# Patient Record
Sex: Female | Born: 1965
Health system: Southern US, Community
[De-identification: ages and names within clinical notes are randomized; demographics above are authoritative.]

## PROBLEM LIST (undated history)

## (undated) DIAGNOSIS — C50919 Malignant neoplasm of unspecified site of unspecified female breast: Secondary | ICD-10-CM

## (undated) DIAGNOSIS — M1990 Unspecified osteoarthritis, unspecified site: Secondary | ICD-10-CM

## (undated) DIAGNOSIS — Z9221 Personal history of antineoplastic chemotherapy: Secondary | ICD-10-CM

## (undated) DIAGNOSIS — C50911 Malignant neoplasm of unspecified site of right female breast: Secondary | ICD-10-CM

## (undated) DIAGNOSIS — Z923 Personal history of irradiation: Secondary | ICD-10-CM

## (undated) DIAGNOSIS — Z9189 Other specified personal risk factors, not elsewhere classified: Secondary | ICD-10-CM

## (undated) DIAGNOSIS — N200 Calculus of kidney: Secondary | ICD-10-CM

## (undated) DIAGNOSIS — K219 Gastro-esophageal reflux disease without esophagitis: Secondary | ICD-10-CM

## (undated) HISTORY — DX: Gastro-esophageal reflux disease without esophagitis: K21.9

## (undated) HISTORY — DX: Calculus of kidney: N20.0

## (undated) HISTORY — DX: Other specified personal risk factors, not elsewhere classified: Z91.89

## (undated) HISTORY — DX: Unspecified osteoarthritis, unspecified site: M19.90

---

## 1993-03-07 DIAGNOSIS — Z789 Other specified health status: Secondary | ICD-10-CM

## 1993-03-07 HISTORY — DX: Other specified health status: Z78.9

## 1993-03-07 HISTORY — PX: ABDOMINAL HYSTERECTOMY: SHX81

## 2004-07-22 ENCOUNTER — Ambulatory Visit: Payer: Self-pay

## 2004-12-29 ENCOUNTER — Ambulatory Visit: Payer: Self-pay | Admitting: Internal Medicine

## 2005-08-02 ENCOUNTER — Ambulatory Visit: Payer: Self-pay

## 2006-08-14 ENCOUNTER — Ambulatory Visit: Payer: Self-pay

## 2007-02-20 ENCOUNTER — Other Ambulatory Visit: Payer: Self-pay

## 2007-02-20 ENCOUNTER — Emergency Department: Payer: Self-pay | Admitting: Emergency Medicine

## 2007-08-15 ENCOUNTER — Ambulatory Visit: Payer: Self-pay

## 2008-08-15 ENCOUNTER — Ambulatory Visit: Payer: Self-pay | Admitting: Obstetrics and Gynecology

## 2008-12-31 ENCOUNTER — Emergency Department: Payer: Self-pay | Admitting: Emergency Medicine

## 2009-08-18 ENCOUNTER — Ambulatory Visit: Payer: Self-pay

## 2010-07-28 ENCOUNTER — Ambulatory Visit: Payer: Self-pay

## 2011-08-03 ENCOUNTER — Ambulatory Visit: Payer: Self-pay

## 2012-08-29 ENCOUNTER — Ambulatory Visit: Payer: Self-pay

## 2013-02-16 ENCOUNTER — Ambulatory Visit: Payer: Self-pay | Admitting: Emergency Medicine

## 2013-02-16 LAB — URINALYSIS, COMPLETE
Bilirubin,UR: NEGATIVE
Glucose,UR: NEGATIVE mg/dL (ref 0–75)
Leukocyte Esterase: NEGATIVE
Nitrite: NEGATIVE
Ph: 7.5 (ref 4.5–8.0)
Protein: NEGATIVE

## 2013-02-18 LAB — URINE CULTURE

## 2013-02-24 ENCOUNTER — Emergency Department: Payer: Self-pay | Admitting: Emergency Medicine

## 2013-02-24 LAB — URINALYSIS, COMPLETE
Bilirubin,UR: NEGATIVE
Glucose,UR: NEGATIVE mg/dL (ref 0–75)
Ketone: NEGATIVE
Leukocyte Esterase: NEGATIVE
Nitrite: NEGATIVE
RBC,UR: <1 /HPF (ref 0–5)
Specific Gravity: 1.011 (ref 1.003–1.030)
WBC UR: <1 /HPF (ref 0–5)

## 2013-04-03 ENCOUNTER — Ambulatory Visit: Payer: Self-pay | Admitting: Urology

## 2013-06-14 ENCOUNTER — Ambulatory Visit: Payer: Self-pay | Admitting: Gastroenterology

## 2013-06-19 LAB — PATHOLOGY REPORT

## 2013-10-03 ENCOUNTER — Ambulatory Visit: Payer: Self-pay | Admitting: Physician Assistant

## 2014-03-07 HISTORY — PX: COLONOSCOPY: SHX174

## 2014-09-02 ENCOUNTER — Other Ambulatory Visit: Payer: Self-pay | Admitting: Physician Assistant

## 2014-09-02 DIAGNOSIS — Z1231 Encounter for screening mammogram for malignant neoplasm of breast: Secondary | ICD-10-CM

## 2014-10-06 ENCOUNTER — Ambulatory Visit
Admission: RE | Admit: 2014-10-06 | Discharge: 2014-10-06 | Disposition: A | Discharge status: Home / Self Care | Payer: 59 | Source: Ambulatory Visit | Attending: Physician Assistant | Admitting: Physician Assistant

## 2014-10-06 ENCOUNTER — Ambulatory Visit: Payer: Self-pay

## 2014-10-06 DIAGNOSIS — Z1231 Encounter for screening mammogram for malignant neoplasm of breast: Secondary | ICD-10-CM | POA: Insufficient documentation

## 2015-03-08 DIAGNOSIS — Z923 Personal history of irradiation: Secondary | ICD-10-CM

## 2015-03-08 DIAGNOSIS — Z9221 Personal history of antineoplastic chemotherapy: Secondary | ICD-10-CM

## 2015-03-08 DIAGNOSIS — C50919 Malignant neoplasm of unspecified site of unspecified female breast: Secondary | ICD-10-CM

## 2015-03-08 HISTORY — DX: Personal history of irradiation: Z92.3

## 2015-03-08 HISTORY — DX: Personal history of antineoplastic chemotherapy: Z92.21

## 2015-03-08 HISTORY — DX: Malignant neoplasm of unspecified site of unspecified female breast: C50.919

## 2015-03-08 HISTORY — PX: BREAST BIOPSY: SHX20

## 2015-09-17 ENCOUNTER — Other Ambulatory Visit: Payer: Self-pay | Admitting: Physician Assistant

## 2015-09-17 DIAGNOSIS — Z1231 Encounter for screening mammogram for malignant neoplasm of breast: Secondary | ICD-10-CM

## 2015-10-07 ENCOUNTER — Ambulatory Visit: Payer: 59

## 2015-10-14 ENCOUNTER — Other Ambulatory Visit: Payer: Self-pay | Admitting: Physician Assistant

## 2015-10-14 ENCOUNTER — Ambulatory Visit
Admission: RE | Admit: 2015-10-14 | Discharge: 2015-10-14 | Disposition: A | Discharge status: Home / Self Care | Payer: 59 | Source: Ambulatory Visit | Attending: Physician Assistant | Admitting: Physician Assistant

## 2015-10-14 DIAGNOSIS — R928 Other abnormal and inconclusive findings on diagnostic imaging of breast: Secondary | ICD-10-CM | POA: Diagnosis not present

## 2015-10-14 DIAGNOSIS — Z1231 Encounter for screening mammogram for malignant neoplasm of breast: Secondary | ICD-10-CM

## 2015-10-22 ENCOUNTER — Other Ambulatory Visit: Payer: Self-pay | Admitting: Physician Assistant

## 2015-10-22 DIAGNOSIS — N6310 Unspecified lump in the right breast, unspecified quadrant: Secondary | ICD-10-CM

## 2015-10-22 DIAGNOSIS — N6489 Other specified disorders of breast: Secondary | ICD-10-CM

## 2015-10-23 ENCOUNTER — Ambulatory Visit
Admission: RE | Admit: 2015-10-23 | Discharge: 2015-10-23 | Disposition: A | Discharge status: Home / Self Care | Payer: 59 | Source: Ambulatory Visit | Attending: Physician Assistant | Admitting: Physician Assistant

## 2015-10-23 DIAGNOSIS — N6489 Other specified disorders of breast: Secondary | ICD-10-CM | POA: Diagnosis present

## 2015-10-23 DIAGNOSIS — N63 Unspecified lump in breast: Secondary | ICD-10-CM | POA: Diagnosis not present

## 2015-10-23 DIAGNOSIS — N6310 Unspecified lump in the right breast, unspecified quadrant: Secondary | ICD-10-CM

## 2015-10-27 ENCOUNTER — Other Ambulatory Visit: Payer: Self-pay | Admitting: Physician Assistant

## 2015-10-27 DIAGNOSIS — N6310 Unspecified lump in the right breast, unspecified quadrant: Secondary | ICD-10-CM

## 2015-11-05 ENCOUNTER — Ambulatory Visit
Admission: RE | Admit: 2015-11-05 | Discharge: 2015-11-05 | Disposition: A | Discharge status: Home / Self Care | Payer: 59 | Source: Ambulatory Visit | Attending: Physician Assistant | Admitting: Physician Assistant

## 2015-11-05 DIAGNOSIS — C50911 Malignant neoplasm of unspecified site of right female breast: Secondary | ICD-10-CM | POA: Insufficient documentation

## 2015-11-05 DIAGNOSIS — N6310 Unspecified lump in the right breast, unspecified quadrant: Secondary | ICD-10-CM

## 2015-11-05 DIAGNOSIS — N63 Unspecified lump in breast: Secondary | ICD-10-CM | POA: Diagnosis present

## 2015-11-12 ENCOUNTER — Telehealth: Payer: Self-pay | Admitting: *Deleted

## 2015-11-12 NOTE — Telephone Encounter (Signed)
Called and left patient a message to return my call.  I would like to establish navigation services.  Patient has an appointment with Dr. Tamala Julian on 11/16/15.

## 2015-11-17 ENCOUNTER — Other Ambulatory Visit: Payer: Self-pay | Admitting: Surgery

## 2015-11-17 DIAGNOSIS — C50811 Malignant neoplasm of overlapping sites of right female breast: Secondary | ICD-10-CM

## 2015-11-18 ENCOUNTER — Other Ambulatory Visit: Payer: Self-pay | Admitting: Surgery

## 2015-11-18 ENCOUNTER — Encounter: Payer: Self-pay | Admitting: Oncology

## 2015-11-18 DIAGNOSIS — C50911 Malignant neoplasm of unspecified site of right female breast: Secondary | ICD-10-CM

## 2015-11-18 DIAGNOSIS — C50511 Malignant neoplasm of lower-outer quadrant of right female breast: Secondary | ICD-10-CM | POA: Insufficient documentation

## 2015-11-18 HISTORY — DX: Malignant neoplasm of unspecified site of right female breast: C50.911

## 2015-11-18 NOTE — Progress Notes (Signed)
[place]  [phone] [phone]) [phone]  ID: Melanie Campos OB: [birth year]  MR#: [id number]  YTK#:[id number]  Patient Care Team: Marinda Elk, MD as PCP - General (Physician Assistant)  CHIEF COMPLAINT: Poorly differentiated carcinoma of the right breast.  INTERVAL HISTORY: Patient is a 50 year old female who underwent routine screening mammogram which revealed a Moll abnormality in her right breast. Subsequent biopsy revealed a cystic area, but the fluid aspirated was bloody and had poorly differentiated carcinoma as well. No distinct mass is noted in her breast. Pathology is unclear if this is breast in origin. Currently, patient is anxious but otherwise feels well. She has no neurologic complaints. She denies any recent fevers or illnesses. She has a good appetite and denies weight loss. She has no chest pain or shortness of breath. She denies any nausea, vomiting, constipation, or diarrhea. She has no urinary complaints. Patient feels at her baseline and offers no specific complaints today.  REVIEW OF SYSTEMS:   Review of Systems  Constitutional: Negative.  Negative for fever, malaise/fatigue and weight loss.  Respiratory: Negative.  Negative for cough, hemoptysis and shortness of breath.   Cardiovascular: Negative.  Negative for chest pain and leg swelling.  Gastrointestinal: Negative for abdominal pain, blood in stool and melena.  Genitourinary: Negative.   Musculoskeletal: Negative.   Neurological: Negative.  Negative for weakness.  Psychiatric/Behavioral: The patient is nervous/anxious.     As per HPI. Otherwise, a complete review of systems is negatve.  PAST MEDICAL HISTORY: Past Medical History:  Diagnosis Date  . Arthritis   . Carcinoma of breast ([place]) 11/18/2015  . GERD (gastroesophageal reflux disease)   . Kidney stones   . Last menstrual period (LMP) > 10 days ago 1995    PAST SURGICAL HISTORY: Past Surgical History:    Procedure Laterality Date  . ABDOMINAL HYSTERECTOMY     partial  . COLONOSCOPY  2016    FAMILY HISTORY: Family History  Problem Relation Age of Onset  . Breast cancer Neg Hx     ADVANCED DIRECTIVES (Y/N):  N  HEALTH MAINTENANCE: Social History  Substance Use Topics  . Smoking status: Never Smoker  . Smokeless tobacco: Never Used  . Alcohol use Not on file     Colonoscopy:  PAP:  Bone density:  Lipid panel:  No Known Allergies  Current Outpatient Prescriptions  Medication Sig Dispense Refill  . carisoprodol (SOMA) 350 MG tablet Take 1 tablet by mouth 4 (four) times daily as needed.    . meloxicam (MOBIC) 7.5 MG tablet Take 1 tablet by mouth daily as needed.    Marland Kitchen omeprazole (PRILOSEC) 20 MG capsule Take 1 capsule by mouth daily.    Marland Kitchen acetaminophen (TYLENOL) 325 MG tablet Take 2 tablets by mouth every 4 (four) hours as needed.    Marland Kitchen aspirin EC 81 MG tablet Take 1 tablet by mouth daily.    Marland Kitchen ibuprofen (ADVIL,MOTRIN) 200 MG tablet Take 1 tablet by mouth as needed.    . Multiple Vitamin (MULTI-VITAMINS) TABS Take 1 tablet by mouth daily.    . OnabotulinumtoxinA (BOTOX IJ) Inject 1 Dose as directed every 3 (three) months.     No current facility-administered medications for this visit.     OBJECTIVE: Vitals:   11/19/15 0845  BP: 129/89  Pulse: 72  Resp: 18  Temp: (!) 95.9 F (35.5 C)     Body mass index is 29.9 kg/m.    ECOG FS:0 - Asymptomatic  General: Well-developed, well-nourished,  no acute distress. Eyes: Pink conjunctiva, anicteric sclera. HEENT: Normocephalic, moist mucous membranes, clear oropharnyx. Breasts: Patient requested exam be deferred today. Lungs: Clear to auscultation bilaterally. Heart: Regular rate and rhythm. No rubs, murmurs, or gallops. Abdomen: Soft, nontender, nondistended. No organomegaly noted, normoactive bowel sounds. Musculoskeletal: No edema, cyanosis, or clubbing. Neuro: Alert, answering all questions appropriately. Cranial  nerves grossly intact. Skin: No rashes or petechiae noted. Psych: Normal affect. Lymphatics: No cervical, calvicular, axillary or inguinal LAD.   LAB RESULTS:  No results found for: NA, K, CL, CO2, GLUCOSE, BUN, CREATININE, CALCIUM, PROT, ALBUMIN, AST, ALT, ALKPHOS, BILITOT, GFRNONAA, GFRAA  No results found for: WBC, NEUTROABS, HGB, HCT, MCV, PLT   STUDIES: US Aspiration  Addendum Date: 11/13/2015   ADDENDUM REPORT: 11/13/2015 10:38 ADDENDUM: Cytology of the right breast cyst aspiration revealed BREAST, RIGHT 6:00; ULTRASOUND GUIDED ASPIRATION: POSITIVE FOR MALIGNANCY. POORLY DIFFERENTIATED CARCINOMA. Comment: Lymphoid stroma is present. The lesion could be a primary breast carcinoma with prominent lymphoid infiltrate. Metastasis to an intramammary node could be a consideration. ER, PR, and HER2 testing is deferred to an excision specimen but could be performed on the cell block material if necessary. These findings were communicated to [facility] in Dr. Theda Sers' office on 11/06/2015. Read back procedure was performed. This was found to be concordant by Dr. Theda Sers. Recommendation: Further evaluation by either surgical excision or needle core biopsy. Results and recommendations were relayed to Paulita Cradle, PA-C's nurse by Jetta Lout, [facility]) on 11/10/15. She stated they will contact the patient and make the referral. At the patient's request, she was contacted by phone on 11/13/15 by Jetta Lout, [place] for a post biopsy check and confirmation that she was aware of results. She stated she did well following the biopsy with no bleeding, bruising, or hematoma. She has discussed results with Paulita Cradle, PA and has an appointment with Dr. Tamala Julian (surgeon) on 11/16/15. The patient was encouraged to call the [facility] or [place], [place] with any further questions or concerns. Addendum by Jetta Lout, RRA on 11/13/15. Electronically  Signed   By: Ammie Ferrier M.D.   On: 11/13/2015 10:38   Result Date: 11/13/2015 CLINICAL DATA:  50 year old female presenting for cyst aspiration of a right breast cyst. EXAM: ULTRASOUND GUIDED RIGHT BREAST CYST ASPIRATION COMPARISON:  Previous exams. PROCEDURE: Using sterile technique, 1% lidocaine, under direct ultrasound visualization, needle aspiration of a cyst in the right breast at 6 o'clock was performed. Approximately 2 mL of bloody fluid was aspirated from the cyst. This was sent for cytologic evaluation. The cyst collapsed entirely with aspiration. A wing shaped biopsy marking clip was placed following aspiration. A post aspiration mammogram will be performed and dictated separately. IMPRESSION: Ultrasound-guided aspiration of a right breast cyst at 6 o'clock No apparent complications. RECOMMENDATIONS: Appropriate follow-up will depend on the cytology lab results. Electronically Signed: By: Ammie Ferrier M.D. On: 11/05/2015 14:01   US Breast Ltd Uni Right Inc Axilla  Result Date: 10/23/2015 CLINICAL DATA:  Patient was called back from screening mammogram for a possible mass and asymmetry in the right breast. EXAM: 2D DIGITAL DIAGNOSTIC RIGHT MAMMOGRAM WITH CAD AND ADJUNCT TOMO ULTRASOUND RIGHT BREAST COMPARISON:  Previous exam(s). ACR Breast Density Category c: The breast tissue is heterogeneously dense, which may obscure small masses. FINDINGS: Additional imaging of the right breast was performed showing there is a 10 mm mass with obscured borders in the 6 o'clock region of the breast. There  are no malignant type microcalcifications. Mammographic images were processed with CAD. On physical exam, I do not palpate a mass in the 6 o'clock region of the right breast. Targeted ultrasound is performed, showing a hypoechoic mass with well-defined borders in the 6 o'clock region the right breast 6 cm from the nipple measuring 7 x 6 x 6 mm. There are internal echoes in the mass and it does not meet  the criteria of a simple cyst. Sonographic evaluation of the right axilla does not show any enlarged adenopathy. IMPRESSION: Indeterminate mass in the 6 o'clock region the right breast. RECOMMENDATION: Attempt at ultrasound-guided aspiration of the mass in the 6 o'clock region of the right breast is recommended. If the lesion does not decompress with aspiration I would recommend an ultrasound-guided core biopsy. I have discussed the findings and recommendations with the patient. Results were also provided in writing at the conclusion of the visit. If applicable, a reminder letter will be sent to the patient regarding the next appointment. BI-RADS CATEGORY  4: Suspicious. Electronically Signed   By: Lillia Mountain M.D.   On: 10/23/2015 14:59   Mm Diag Breast Tomo Uni Right  Result Date: 10/23/2015 CLINICAL DATA:  Patient was called back from screening mammogram for a possible mass and asymmetry in the right breast. EXAM: 2D DIGITAL DIAGNOSTIC RIGHT MAMMOGRAM WITH CAD AND ADJUNCT TOMO ULTRASOUND RIGHT BREAST COMPARISON:  Previous exam(s). ACR Breast Density Category c: The breast tissue is heterogeneously dense, which may obscure small masses. FINDINGS: Additional imaging of the right breast was performed showing there is a 10 mm mass with obscured borders in the 6 o'clock region of the breast. There are no malignant type microcalcifications. Mammographic images were processed with CAD. On physical exam, I do not palpate a mass in the 6 o'clock region of the right breast. Targeted ultrasound is performed, showing a hypoechoic mass with well-defined borders in the 6 o'clock region the right breast 6 cm from the nipple measuring 7 x 6 x 6 mm. There are internal echoes in the mass and it does not meet the criteria of a simple cyst. Sonographic evaluation of the right axilla does not show any enlarged adenopathy. IMPRESSION: Indeterminate mass in the 6 o'clock region the right breast. RECOMMENDATION: Attempt at  ultrasound-guided aspiration of the mass in the 6 o'clock region of the right breast is recommended. If the lesion does not decompress with aspiration I would recommend an ultrasound-guided core biopsy. I have discussed the findings and recommendations with the patient. Results were also provided in writing at the conclusion of the visit. If applicable, a reminder letter will be sent to the patient regarding the next appointment. BI-RADS CATEGORY  4: Suspicious. Electronically Signed   By: Lillia Mountain M.D.   On: 10/23/2015 14:59   Mm Clip Placement Right  Result Date: 11/05/2015 CLINICAL DATA:  Post biopsy mammogram of the right breast following clip placement. EXAM: DIAGNOSTIC RIGHT MAMMOGRAM POST ULTRASOUND ASPIRATION COMPARISON:  Previous exam(s). FINDINGS: Mammographic images were obtained following ultrasound guided aspiration of a right breast cyst at 6 o'clock. The wing shaped biopsy marking clip is appropriately positioned at the site of aspiration at 6 o'clock. IMPRESSION: Appropriate positioning of the wing shaped biopsy marking clip at the site of aspiration at 6 o'clock. Final Assessment: Post Procedure Mammograms for Marker Placement Electronically Signed   By: Ammie Ferrier M.D.   On: 11/05/2015 14:03    ASSESSMENT: Poorly differentiated carcinoma of the right breast  PLAN:  1. Poorly differentiated carcinoma of the right breast:  Case discussed with radiology and pathology and it is unclear if this is a breast primary or possibly a metastatic focus. Case was discussed at breast tumor board and it was determined to further assess the patient with imaging using CT scan of the chest, abdomen, and pelvis. If this is positive, patient will return to clinic to discuss the results. If negative we will pursue MRI of the breast. Currently, patient is scheduled for lumpectomy on December 08, 2015. We will keep this scheduled pending the results of her imaging. Tumor markers were ordered today for  completeness and are pending at time of dictation.  Approximately 45 minutes was spent in discussion of which greater than 50% was consultation.   Patient expressed understanding and was in agreement with this plan. She also understands that She can call clinic at any time with any questions, concerns, or complaints.   No matching staging information was found for the patient.  Lloyd Huger, MD   11/19/2015 12:24 PM

## 2015-11-19 ENCOUNTER — Encounter: Payer: Self-pay | Admitting: *Deleted

## 2015-11-19 ENCOUNTER — Encounter: Payer: Self-pay | Admitting: Oncology

## 2015-11-19 ENCOUNTER — Inpatient Hospital Stay: Payer: 59

## 2015-11-19 ENCOUNTER — Inpatient Hospital Stay: Payer: 59 | Attending: Oncology | Admitting: Oncology

## 2015-11-19 DIAGNOSIS — C50911 Malignant neoplasm of unspecified site of right female breast: Secondary | ICD-10-CM | POA: Insufficient documentation

## 2015-11-19 DIAGNOSIS — M1990 Unspecified osteoarthritis, unspecified site: Secondary | ICD-10-CM | POA: Insufficient documentation

## 2015-11-19 DIAGNOSIS — Z79899 Other long term (current) drug therapy: Secondary | ICD-10-CM | POA: Insufficient documentation

## 2015-11-19 DIAGNOSIS — K219 Gastro-esophageal reflux disease without esophagitis: Secondary | ICD-10-CM | POA: Diagnosis not present

## 2015-11-19 NOTE — Progress Notes (Signed)
  Oncology Nurse Navigator Documentation  Navigator Location: [facility]-Med Onc (11/19/15 1600) Navigator Encounter Type: Initial MedOnc (11/19/15 1600)           Patient Visit Type: Initial (11/19/15 1600) Treatment Phase: Pre-Tx/Tx Discussion (11/19/15 1600) Barriers/Navigation Needs: Education (11/19/15 1600) Education: Understanding Cancer/ Treatment Options;Coping with Diagnosis/ Prognosis;Newly Diagnosed Cancer Education (11/19/15 1600)          Support Groups/Services: Breast Support Group (11/19/15 1600)   Acuity: Level 2 (11/19/15 1600)         Time Spent with Patient: 90 (11/19/15 1600)   Met with patient and her husband today during her initial medical oncology visit with Dr. Grayland Ormond.  Dr. Grayland Ormond discussed need for further testing with CT, labs and possible MRI.  Gave patient breast cancer educational literature, "My Breast Cancer Treatment Handbook" by Josephine Igo, RN.  Patient verbalizes understanding that this may not be a breast cancer.  She is to call if she has any questions or needs.

## 2015-11-20 LAB — CYTOLOGY - NON PAP

## 2015-11-25 ENCOUNTER — Ambulatory Visit
Admission: RE | Admit: 2015-11-25 | Discharge: 2015-11-25 | Disposition: A | Discharge status: Home / Self Care | Payer: 59 | Source: Ambulatory Visit | Attending: Oncology | Admitting: Oncology

## 2015-11-25 DIAGNOSIS — C50911 Malignant neoplasm of unspecified site of right female breast: Secondary | ICD-10-CM | POA: Diagnosis not present

## 2015-11-25 HISTORY — DX: Malignant neoplasm of unspecified site of right female breast: C50.911

## 2015-11-25 MED ORDER — IOPAMIDOL (ISOVUE-300) INJECTION 61%
75.0000 mL | Freq: Once | INTRAVENOUS | Status: AC | PRN
  Administered 2015-11-25: 75 mL via INTRAVENOUS

## 2015-11-27 ENCOUNTER — Ambulatory Visit: Admission: RE | Admit: 2015-11-27 | Payer: 59 | Source: Ambulatory Visit | Admitting: Surgery

## 2015-11-27 ENCOUNTER — Encounter: Payer: Self-pay | Admitting: *Deleted

## 2015-11-27 DIAGNOSIS — C50911 Malignant neoplasm of unspecified site of right female breast: Secondary | ICD-10-CM | POA: Diagnosis not present

## 2015-11-27 NOTE — Patient Instructions (Addendum)
  Your procedure is scheduled [id number] 3, 2017 (Tuesday) Report to Radiology Desk ([place])  ARRIVAL TIME 8:00 AM   Remember: Instructions that are not followed completely may result in serious medical risk, up to and including death, or upon the discretion of your surgeon and anesthesiologist your surgery may need to be rescheduled.    _x___ 1. Do not eat food or drink liquids after midnight. No gum chewing or hard candies.     _x__ 2. No Alcohol for 24 hours before or after surgery.   _x__3. No Smoking for 24 prior to surgery.   ____  4. Bring all medications with you on the day of surgery if instructed.    __x__ 5. Notify your doctor if there is any change in your medical condition     (cold, fever, infections).     Do not wear jewelry, make-up, hairpins, clips or nail polish.  Do not wear lotions, powders, or perfumes. You may wear deodorant.  Do not shave 48 hours prior to surgery. Men may shave face and neck.  Do not bring valuables to the hospital.    [facility] is not responsible for any belongings or valuables.               Contacts, dentures or bridgework may not be worn into surgery.  Leave your suitcase in the car. After surgery it may be brought to your room.  For patients admitted to the hospital, discharge time is determined by your treatment team.   Patients discharged the day of surgery will not be allowed to drive home.    Please read over the following fact sheets that you were given:   [facility] Preparing for Surgery and or MRSA Information   _x___ Take these medicines the morning of surgery with A SIP OF WATER:    1. Prilosec (Prilosec at bedtime the night before surgery)  2.  3.  4.  5.  6.  ____Fleets enema or Magnesium Citrate as directed.   _x___ Use CHG Soap or sage wipes as directed on instruction sheet   ____ Use inhalers on the day of surgery and bring to hospital day of surgery  ____ Stop metformin 2 days  prior to surgery    ____ Take 1/2 of usual insulin dose the night before surgery and none on the morning of           surgery.   __x__ Stop aspirin or coumadin, or plavix ( Stop Aspirin one week prior to surgery.)  x__ Stop Anti-inflammatories such as Advil, Aleve, Ibuprofen, Motrin, Naproxen,          Naprosyn, Goodies powders, Meloxicam, or aspirin products. Ok to take Tylenol if needed.   ____ Stop supplements until after surgery.    ____ Bring C-Pap to the hospital.

## 2015-11-27 NOTE — OR Nursing (Signed)
Called patient and left a message for her to call back to [facility] To review information for upcoming surgery.

## 2015-12-08 ENCOUNTER — Encounter: Payer: Self-pay | Admitting: *Deleted

## 2015-12-08 ENCOUNTER — Encounter
Admission: RE | Disposition: A | Discharge status: Home / Self Care | Payer: Self-pay | Source: Ambulatory Visit | Attending: Surgery

## 2015-12-08 ENCOUNTER — Ambulatory Visit
Admission: RE | Admit: 2015-12-08 | Discharge: 2015-12-08 | Disposition: A | Discharge status: Home / Self Care | Payer: 59 | Source: Ambulatory Visit | Attending: Surgery | Admitting: Surgery

## 2015-12-08 ENCOUNTER — Ambulatory Visit: Payer: 59 | Admitting: Anesthesiology

## 2015-12-08 ENCOUNTER — Encounter
Admission: RE | Admit: 2015-12-08 | Discharge: 2015-12-08 | Disposition: A | Discharge status: Home / Self Care | Payer: 59 | Source: Ambulatory Visit | Attending: Surgery | Admitting: Surgery

## 2015-12-08 DIAGNOSIS — C50911 Malignant neoplasm of unspecified site of right female breast: Secondary | ICD-10-CM

## 2015-12-08 DIAGNOSIS — Z79899 Other long term (current) drug therapy: Secondary | ICD-10-CM | POA: Insufficient documentation

## 2015-12-08 DIAGNOSIS — M1990 Unspecified osteoarthritis, unspecified site: Secondary | ICD-10-CM | POA: Insufficient documentation

## 2015-12-08 DIAGNOSIS — Z90710 Acquired absence of both cervix and uterus: Secondary | ICD-10-CM | POA: Diagnosis not present

## 2015-12-08 DIAGNOSIS — Z171 Estrogen receptor negative status [ER-]: Secondary | ICD-10-CM | POA: Diagnosis not present

## 2015-12-08 DIAGNOSIS — C50811 Malignant neoplasm of overlapping sites of right female breast: Secondary | ICD-10-CM

## 2015-12-08 DIAGNOSIS — K219 Gastro-esophageal reflux disease without esophagitis: Secondary | ICD-10-CM | POA: Insufficient documentation

## 2015-12-08 DIAGNOSIS — Z87442 Personal history of urinary calculi: Secondary | ICD-10-CM | POA: Diagnosis not present

## 2015-12-08 HISTORY — PX: PARTIAL MASTECTOMY WITH NEEDLE LOCALIZATION: SHX6008

## 2015-12-08 HISTORY — PX: SENTINEL NODE BIOPSY: SHX6608

## 2015-12-08 HISTORY — PX: BREAST EXCISIONAL BIOPSY: SUR124

## 2015-12-08 SURGERY — PARTIAL MASTECTOMY WITH NEEDLE LOCALIZATION
Anesthesia: General | Site: Breast | Laterality: Right | Wound class: Clean

## 2015-12-08 MED ORDER — PROPOFOL 10 MG/ML IV BOLUS
INTRAVENOUS | Status: DC | PRN
  Administered 2015-12-08 (×2): 20 mg via INTRAVENOUS
  Administered 2015-12-08 (×2): 120 mg via INTRAVENOUS
  Administered 2015-12-08: 20 mg via INTRAVENOUS

## 2015-12-08 MED ORDER — OXYCODONE HCL 5 MG PO TABS: 5.0000 mg | ORAL_TABLET | Freq: Once | ORAL | Status: DC | PRN

## 2015-12-08 MED ORDER — FENTANYL CITRATE (PF) 100 MCG/2ML IJ SOLN: 25.0000 ug | INTRAMUSCULAR | Status: DC | PRN

## 2015-12-08 MED ORDER — TECHNETIUM TC 99M SULFUR COLLOID
0.9300 | Freq: Once | INTRAVENOUS | Status: AC | PRN
  Administered 2015-12-08: 0.93 via INTRAVENOUS

## 2015-12-08 MED ORDER — FENTANYL CITRATE (PF) 100 MCG/2ML IJ SOLN
INTRAMUSCULAR | Status: DC | PRN
  Administered 2015-12-08 (×2): 50 ug via INTRAVENOUS

## 2015-12-08 MED ORDER — MIDAZOLAM HCL 2 MG/2ML IJ SOLN
INTRAMUSCULAR | Status: DC | PRN
  Administered 2015-12-08 (×2): 1 mg via INTRAVENOUS

## 2015-12-08 MED ORDER — LIDOCAINE HCL (CARDIAC) 20 MG/ML IV SOLN
INTRAVENOUS | Status: DC | PRN
Start: 2015-12-08 — End: 2015-12-08
  Administered 2015-12-08: 40 mg via INTRAVENOUS

## 2015-12-08 MED ORDER — ONDANSETRON HCL 4 MG/2ML IJ SOLN
INTRAMUSCULAR | Status: DC | PRN
  Administered 2015-12-08: 4 mg via INTRAVENOUS

## 2015-12-08 MED ORDER — HYDROCODONE-ACETAMINOPHEN 5-325 MG PO TABS: 1.0000 | ORAL_TABLET | ORAL | Status: DC | PRN

## 2015-12-08 MED ORDER — LACTATED RINGERS IV SOLN
INTRAVENOUS | Status: DC
  Administered 2015-12-08 (×2): via INTRAVENOUS

## 2015-12-08 MED ORDER — BUPIVACAINE-EPINEPHRINE (PF) 0.5% -1:200000 IJ SOLN
INTRAMUSCULAR | Status: DC | PRN
  Administered 2015-12-08: 20 mL via PERINEURAL

## 2015-12-08 MED ORDER — BUPIVACAINE-EPINEPHRINE (PF) 0.5% -1:200000 IJ SOLN
INTRAMUSCULAR | Status: AC
  Filled 2015-12-08: qty 30

## 2015-12-08 MED ORDER — HYDROCODONE-ACETAMINOPHEN 5-325 MG PO TABS: 1.0000 | ORAL_TABLET | ORAL | 0 refills | Status: DC | PRN

## 2015-12-08 MED ORDER — EPHEDRINE SULFATE 50 MG/ML IJ SOLN
INTRAMUSCULAR | Status: DC | PRN
Start: 2015-12-08 — End: 2015-12-08
  Administered 2015-12-08: 10 mg via INTRAVENOUS

## 2015-12-08 MED ORDER — METHYLENE BLUE 0.5 % INJ SOLN
INTRAVENOUS | Status: AC
  Filled 2015-12-08: qty 10

## 2015-12-08 MED ORDER — OXYCODONE HCL 5 MG/5ML PO SOLN: 5.0000 mg | Freq: Once | ORAL | Status: DC | PRN

## 2015-12-08 SURGICAL SUPPLY — 31 items
BLADE SURG 15 STRL LF DISP TIS (BLADE) ×1 IMPLANT
BLADE SURG 15 STRL SS (BLADE) ×1
CANISTER SUCT 1200ML W/VALVE (MISCELLANEOUS) ×2 IMPLANT
CHLORAPREP W/TINT 26ML (MISCELLANEOUS) ×2 IMPLANT
CNTNR SPEC 2.5X3XGRAD LEK (MISCELLANEOUS) ×2
CONT SPEC 4OZ STER OR WHT (MISCELLANEOUS) ×2
CONTAINER SPEC 2.5X3XGRAD LEK (MISCELLANEOUS) ×2 IMPLANT
DEVICE DUBIN SPECIMEN MAMMOGRA (MISCELLANEOUS) ×2 IMPLANT
DRAPE LAPAROTOMY 77X122 PED (DRAPES) ×2 IMPLANT
ELECT REM PT RETURN 9FT ADLT (ELECTROSURGICAL) ×2
ELECTRODE REM PT RTRN 9FT ADLT (ELECTROSURGICAL) ×1 IMPLANT
GLOVE BIO SURGEON STRL SZ7.5 (GLOVE) ×6 IMPLANT
GOWN STRL REUS W/ TWL LRG LVL3 (GOWN DISPOSABLE) ×3 IMPLANT
GOWN STRL REUS W/TWL LRG LVL3 (GOWN DISPOSABLE) ×3
KIT RM TURNOVER STRD PROC AR (KITS) ×2 IMPLANT
LABEL OR SOLS (LABEL) ×2 IMPLANT
LIQUID BAND (GAUZE/BANDAGES/DRESSINGS) ×2 IMPLANT
MARGIN MAP 10MM (MISCELLANEOUS) ×2 IMPLANT
NDL SAFETY 18GX1.5 (NEEDLE) ×2 IMPLANT
NDL SAFETY 22GX1.5 (NEEDLE) ×2 IMPLANT
NEEDLE HYPO 25X1 1.5 SAFETY (NEEDLE) ×2 IMPLANT
PACK BASIN MINOR ARMC (MISCELLANEOUS) ×2 IMPLANT
SLEVE PROBE SENORX GAMMA FIND (MISCELLANEOUS) ×2 IMPLANT
SUT CHROMIC 4 0 RB 1X27 (SUTURE) ×2 IMPLANT
SUT ETHILON 3-0 FS-10 30 BLK (SUTURE) ×2
SUT MNCRL 4-0 (SUTURE) ×1
SUT MNCRL 4-0 27XMFL (SUTURE) ×1
SUTURE EHLN 3-0 FS-10 30 BLK (SUTURE) ×1 IMPLANT
SUTURE MNCRL 4-0 27XMF (SUTURE) ×1 IMPLANT
SYRINGE 10CC LL (SYRINGE) ×2 IMPLANT
WATER STERILE IRR 1000ML POUR (IV SOLUTION) ×2 IMPLANT

## 2015-12-08 NOTE — Anesthesia Procedure Notes (Signed)
Procedure Name: LMA Insertion Date/Time: 12/08/2015 11:47 AM Performed by: Courtney [place] Pre-anesthesia Checklist: Patient identified, Patient being monitored, Timeout performed, Emergency Drugs available and Suction available Patient Re-evaluated:Patient Re-evaluated prior to inductionOxygen Delivery Method: Circle system utilized Preoxygenation: Pre-oxygenation with 100% oxygen Intubation Type: IV induction Ventilation: Mask ventilation without difficulty LMA: LMA inserted LMA Size: 3.0 Grade View: Grade II Tube type: Oral Number of attempts: 1 Placement Confirmation: positive ETCO2,  breath sounds checked- equal and bilateral and CO2 detector Tube secured with: Tape Dental Injury: Teeth and Oropharynx as per pre-operative assessment

## 2015-12-08 NOTE — Anesthesia Procedure Notes (Signed)
Anesthesia Procedure Note     

## 2015-12-08 NOTE — Anesthesia Preprocedure Evaluation (Signed)
Anesthesia Evaluation  Patient identified by MRN, date of birth, ID band Patient awake    Reviewed: Allergy & Precautions, H&P , NPO status , Patient's Chart, lab work & pertinent test results  History of Anesthesia Complications Negative for: history of anesthetic complications  Airway Mallampati: II  TM Distance: <3 FB Neck ROM: full    Dental  (+) Poor Dentition, Chipped   Pulmonary neg pulmonary ROS, neg shortness of breath,    Pulmonary exam normal breath sounds clear to auscultation       Cardiovascular Exercise Tolerance: Good (-) angina(-) Past MI and (-) DOE negative cardio ROS Normal cardiovascular exam Rhythm:regular Rate:Normal     Neuro/Psych negative neurological ROS  negative psych ROS   GI/Hepatic Neg liver ROS, GERD  Controlled and Medicated,  Endo/Other  negative endocrine ROS  Renal/GU Renal disease     Musculoskeletal  (+) Arthritis ,   Abdominal   Peds  Hematology negative hematology ROS (+)   Anesthesia Other Findings Past Medical History: No date: Arthritis     Comment: in neck 11/18/2015: Breast cancer, right breast ([facility]) No date: GERD (gastroesophageal reflux disease) No date: Kidney stones 1995: Last menstrual period (LMP) > 10 days ago  Past Surgical History: 1995: ABDOMINAL HYSTERECTOMY     Comment: partial 2017: BREAST BIOPSY Right 2016: COLONOSCOPY  BMI    Body Mass Index:  29.12 kg/m      Reproductive/Obstetrics negative OB ROS                             Anesthesia Physical Anesthesia Plan  ASA: III  Anesthesia Plan: General LMA   Post-op Pain Management:    Induction:   Airway Management Planned:   Additional Equipment:   Intra-op Plan:   Post-operative Plan:   Informed Consent: I have reviewed the patients History and Physical, chart, labs and discussed the procedure including the risks, benefits and alternatives for the  proposed anesthesia with the patient or authorized representative who has indicated his/her understanding and acceptance.   Dental Advisory Given  Plan Discussed with: Anesthesiologist, CRNA and Surgeon  Anesthesia Plan Comments:         Anesthesia Quick Evaluation

## 2015-12-08 NOTE — Discharge Instructions (Addendum)
Take Tylenol or Norco if needed for pain.  May resume aspirin on Wednesday.  May shower.  May wear bra as desired for comfort and support.  AMBULATORY SURGERY  DISCHARGE INSTRUCTIONS   1) The drugs that you were given will stay in your system until tomorrow so for the next 24 hours you should not:  A) Drive an automobile B) Make any legal decisions C) Drink any alcoholic beverage   2) You may resume regular meals tomorrow.  Today it is better to start with liquids and gradually work up to solid foods.  You may eat anything you prefer, but it is better to start with liquids, then soup and crackers, and gradually work up to solid foods.   3) Please notify your doctor immediately if you have any unusual bleeding, trouble breathing, redness and pain at the surgery site, drainage, fever, or pain not relieved by medication.    4) Additional Instructions:        Please contact your physician with any problems or Same Day Surgery at [phone], Monday through Friday 6 am to 4 pm, or [place] at [facility] number at [phone].

## 2015-12-08 NOTE — Op Note (Signed)
OPERATIVE REPORT  PREOPERATIVE  DIAGNOSIS: . Right breast cancer  POSTOPERATIVE DIAGNOSIS: . Right breast cancer  PROCEDURE: . Right partial mastectomy with sentinel lymph node biopsy  ANESTHESIA:  General  SURGEON: Rochel Brome  MD   INDICATIONS: . She had recent mammograms depicting a density in the inferior aspect of the right breast. Ultrasound demonstrated a mass of the same site 6 cm from the nipple at the 6:00 position. Ultrasound-guided core needle biopsy demonstrated infiltrating mammary carcinoma. Surgery was recommended for definitive treatment. The patient did have preoperative insertion of Kopan's wire. X-rays were reviewed. She also had injection of radioactive technetium sulfur colloid.  With the patient on the operating table in the supine position under general anesthesia the right arm was placed on a lateral arm support. The dressing was removed from the inferior aspect of the right breast exposing the Kopan's wire which was cut 2 cm from the skin. The breast and surrounding chest wall were prepared with ChloraPrep and draped in a sterile manner  A transversely oriented curvilinear incision was made 5 cm from the nipple removing an ellipse of skin which was approximately 8 mm in width. Dissection was carried down deeply within the inferior aspect of the breast to encounter the Kopan's wire. Dissection was carried as far as the deep fascia. A mass of tissue proximally 4 x 4 by 4 cm in dimension was resected. As this was being resected sutures were used to attach markers to the medial lateral cranial caudal and deep margins. With further dissection the mass was excised and submitted with Kopan's wire intact for specimen mammogram and pathology. The radiologist later called to report that the biopsy marker was seen centrally located within the specimen. The pathologist later called back to report that the margins appeared satisfactory.  Attention was turned to the axilla which was  probed with a gamma counter. An oblique incision was made in the inferior aspect of the right axilla and carried down through subcutaneous tissues. Several small bleeding points were cauterized. Dissection was carried down deeply within the inferior aspect of the axilla adjacent to the rib cage to encounter radioactivity in a lymph node. This lymph node appeared to be approximatly 7 mm in dimension and was dissected free from surrounding structures. The ex vivo measurement was in the range of 1700-2000 counts per second.. The background count was less than 30. There was no remaining palpable or visible mass within the axilla. Hemostasis was intact.  The axillary wound was infiltrated with half percent Sensorcaine with epinephrine in the subcutaneous tissues. The wound was closed with running 4-0 Monocryl subcuticular suture.  The breast wound also infiltrated with half percent Sensorcaine with epinephrine,was further inspected and could see hemostasis was intact. Subcutaneous tissues were closed with interrupted 4-0 chromic. The skin was closed with running 4-0 Monocryl subcuticular suture. Both wounds were treated with LiquiBand and allowed to dry. The patient tolerated surgery satisfactorily and was prepared for transfer to the recovery room  [facility].D.

## 2015-12-08 NOTE — Transfer of Care (Signed)
Immediate Anesthesia Transfer of Care Note  Patient: Melanie Campos  Procedure(s) Performed: Procedure(s): PARTIAL MASTECTOMY WITH NEEDLE LOCALIZATION (Right) SENTINEL NODE BIOPSY (Right)  Patient Location: PACU  Anesthesia Type:General  Level of Consciousness: sedated and patient cooperative  Airway & Oxygen Therapy: Patient Spontanous Breathing and Patient connected to face mask oxygen  Post-op Assessment: Report given to RN and Post -op Vital signs reviewed and stable  Post vital signs: Reviewed and stable  Last Vitals:  Vitals:   12/08/15 0957  BP: 134/85  Pulse: 68  Resp: 16  Temp: 36.7 C    Last Pain:  Vitals:   12/08/15 0957  TempSrc: Oral         Complications: No apparent anesthesia complications

## 2015-12-08 NOTE — Progress Notes (Signed)
Spoke with [facility] nurse in regards to pt ready for discharge. Dr. Tamala Julian in surgery for another hour to hour and a half. Pt stable and doing well. Dr. Tamala Julian ok'd pt to go home. Melanie Campos E 2:28 PM 12/08/2015

## 2015-12-08 NOTE — H&P (Signed)
  She reports no change in condition since office visit.  Labs noted.  Discussed plan for right partial mastectomy and sentinel node biopsy.  Right side marked YES

## 2015-12-08 NOTE — Anesthesia Procedure Notes (Signed)
Performed by: Billijo Dilling       

## 2015-12-09 NOTE — Anesthesia Postprocedure Evaluation (Signed)
Anesthesia Post Note  Patient: Melanie Campos  Procedure(s) Performed: Procedure(s) (LRB): PARTIAL MASTECTOMY WITH NEEDLE LOCALIZATION (Right) SENTINEL NODE BIOPSY (Right)  Patient location during evaluation: PACU Anesthesia Type: General Level of consciousness: awake and alert Pain management: pain level controlled Vital Signs Assessment: post-procedure vital signs reviewed and stable Respiratory status: spontaneous breathing, nonlabored ventilation, respiratory function stable and patient connected to nasal cannula oxygen Cardiovascular status: blood pressure returned to baseline and stable Postop Assessment: no signs of nausea or vomiting Anesthetic complications: no    Last Vitals:  Vitals:   12/08/15 1335 12/08/15 1411  BP: 126/76 129/72  Pulse: 66 62  Resp: 15 16  Temp: 36.8 C 36.6 C    Last Pain:  Vitals:   12/08/15 1411  TempSrc: Temporal  PainSc: 2                  Precious Haws Piscitello

## 2015-12-11 LAB — SURGICAL PATHOLOGY

## 2015-12-20 NOTE — Progress Notes (Signed)
[place]  Telephone:(336) [phone] Fax:(336) [phone]  ID: Melanie Campos OB: [birth year]  MR#: [id number]  QGB#:[id number]  Patient Care Team: Marinda Elk, MD as PCP - General (Physician Assistant)  CHIEF COMPLAINT: Pathologic stage Ia poorly differentiated triple negative carcinoma of the lower outer quadrant of the right breast.  INTERVAL HISTORY: Patient returns to clinic today for further evaluation, discussion of her final pathology results, and treatment planning. She underwent lumpectomy in early October that confirmed a diagnosis of triple negative breast cancer. Currently, she continues to have some mild breast tenderness but otherwise feels well. She has no neurologic complaints. She denies any recent fevers or illnesses. She has a good appetite and denies weight loss. She has no chest pain or shortness of breath. She denies any nausea, vomiting, constipation, or diarrhea. She has no urinary complaints. Patient offers no further specific complaints today.  REVIEW OF SYSTEMS:   Review of Systems  Constitutional: Negative.  Negative for fever, malaise/fatigue and weight loss.  Respiratory: Negative.  Negative for cough, hemoptysis and shortness of breath.   Cardiovascular: Negative.  Negative for chest pain and leg swelling.  Gastrointestinal: Negative for abdominal pain, blood in stool and melena.  Genitourinary: Negative.   Musculoskeletal: Negative.   Neurological: Negative.  Negative for weakness.  Psychiatric/Behavioral: Negative.  The patient is not nervous/anxious.     As per HPI. Otherwise, a complete review of systems is negative.  PAST MEDICAL HISTORY: Past Medical History:  Diagnosis Date  . Arthritis    in neck  . Breast cancer, right breast ([place]) 11/18/2015  . GERD (gastroesophageal reflux disease)   . Kidney stones   . Last menstrual period (LMP) > 10 days ago 1995    PAST SURGICAL HISTORY: Past Surgical History:  Procedure  Laterality Date  . ABDOMINAL HYSTERECTOMY  1995   partial  . BREAST BIOPSY Right 2017  . BREAST EXCISIONAL BIOPSY Right 12/08/2015   lumpectomy  . COLONOSCOPY  2016  . PARTIAL MASTECTOMY WITH NEEDLE LOCALIZATION Right 12/08/2015   Procedure: PARTIAL MASTECTOMY WITH NEEDLE LOCALIZATION;  Surgeon: Leonie Green, MD;  Location: [facility] ORS;  Service: General;  Laterality: Right;  . SENTINEL NODE BIOPSY Right 12/08/2015   Procedure: SENTINEL NODE BIOPSY;  Surgeon: Leonie Green, MD;  Location: [facility] ORS;  Service: General;  Laterality: Right;    FAMILY HISTORY: Family History  Problem Relation Age of Onset  . Breast cancer Neg Hx     ADVANCED DIRECTIVES (Y/N):  N  HEALTH MAINTENANCE: Social History  Substance Use Topics  . Smoking status: Never Smoker  . Smokeless tobacco: Never Used  . Alcohol use No     Colonoscopy:  PAP:  Bone density:  Lipid panel:  No Known Allergies  Current Outpatient Prescriptions  Medication Sig Dispense Refill  . acetaminophen (TYLENOL) 325 MG tablet Take 2 tablets by mouth every 4 (four) hours as needed.    Marland Kitchen aspirin EC 81 MG tablet Take 1 tablet by mouth daily.    . carisoprodol (SOMA) 350 MG tablet Take 1 tablet by mouth 4 (four) times daily as needed.    Marland Kitchen HYDROcodone-acetaminophen (NORCO) 5-325 MG tablet Take 1-2 tablets by mouth every 4 (four) hours as needed for moderate pain. 12 tablet 0  . ibuprofen (ADVIL,MOTRIN) 200 MG tablet Take 1 tablet by mouth as needed.    . meloxicam (MOBIC) 7.5 MG tablet Take 1 tablet by mouth daily as needed.    . Multiple Vitamin (MULTI-VITAMINS) TABS  Take 1 tablet by mouth daily.    Marland Kitchen omeprazole (PRILOSEC) 20 MG capsule Take 1 capsule by mouth daily.    . OnabotulinumtoxinA (BOTOX IJ) Inject 1 Dose as directed every 3 (three) months.     No current facility-administered medications for this visit.     OBJECTIVE: Vitals:   12/21/15 1043  BP: (!) 134/92  Pulse: 72  Resp: 18  Temp: (!) 96.6 F  (35.9 C)     Body mass index is 29.48 kg/m.    ECOG FS:0 - Asymptomatic  General: Well-developed, well-nourished, no acute distress. Eyes: Pink conjunctiva, anicteric sclera. Breasts: Well healing surgical scar on right breast.  Lungs: Clear to auscultation bilaterally. Heart: Regular rate and rhythm. No rubs, murmurs, or gallops. Abdomen: Soft, nontender, nondistended. No organomegaly noted, normoactive bowel sounds. Musculoskeletal: No edema, cyanosis, or clubbing. Neuro: Alert, answering all questions appropriately. Cranial nerves grossly intact. Skin: No rashes or petechiae noted. Psych: Normal affect.   LAB RESULTS:  No results found for: NA, K, CL, CO2, GLUCOSE, BUN, CREATININE, CALCIUM, PROT, ALBUMIN, AST, ALT, ALKPHOS, BILITOT, GFRNONAA, GFRAA  No results found for: WBC, NEUTROABS, HGB, HCT, MCV, PLT   STUDIES: Ct Chest W Contrast  Result Date: 11/25/2015 CLINICAL DATA:  Right breast cancer.  Staging workup. EXAM: CT CHEST WITH CONTRAST TECHNIQUE: Multidetector CT imaging of the chest was performed during intravenous contrast administration. CONTRAST:  3m ISOVUE-300 IOPAMIDOL (ISOVUE-300) INJECTION 61% COMPARISON:  None. FINDINGS: Cardiovascular: The heart size is normal. No pericardial effusion. No thoracic aortic aneurysm. Mediastinum/Nodes: No mediastinal lymphadenopathy. There is no hilar lymphadenopathy. The esophagus has normal imaging features. There is no axillary lymphadenopathy. No right subpectoral or supraclavicular lymphadenopathy. Lungs/Pleura: Lungs are clear bilaterally without focal airspace consolidation. No pulmonary nodule or mass. No pulmonary edema or pleural effusion. Subpleural atelectasis noted in the lower lobes bilaterally. Upper Abdomen: Unremarkable. Musculoskeletal: Bone windows reveal no worrisome lytic or sclerotic osseous lesions. IMPRESSION: No evidence for metastatic disease in the chest. Electronically Signed   By: EMisty StanleyM.D.   On:  11/25/2015 20:12   Nm Sentinel Node Injection  Result Date: 12/08/2015 CLINICAL DATA:  Right breast cancer. EXAM: NUCLEAR MEDICINE BREAST LYMPHOSCINTIGRAPHY TECHNIQUE: Intradermal injection of radiopharmaceutical was performed at the upper outer position around the right nipple. The patient was then sent to the operating room where the sentinel node(s) were identified and removed by the surgeon. RADIOPHARMACEUTICALS:  Total of 1 mCi Millipore-filtered Technetium-99mulfur colloid, injected in four aliquots of 0.25 mCi each. IMPRESSION: Uncomplicated intradermal injection of a total of 1 mCi Technetium-9990mlfur colloid for purposes of sentinel node identification. Electronically Signed   By: MarInez CatalinaD.   On: 12/08/2015 10:06   Mm Breast Surgical Specimen  Result Date: 12/08/2015 CLINICAL DATA:  Patient presented for wire localization prior to excisional biopsy of the right breast. EXAM: SPECIMEN RADIOGRAPH OF THE RIGHT BREAST COMPARISON:  Previous exam(s). FINDINGS: Status post excision of the right breast. The wire tip and biopsy marker clip are present. Findings discussed by telephone with Dr. SmiTamala JulianMPRESSION: Specimen radiograph of the right breast. Electronically Signed   By: SusCurlene DolphinD.   On: 12/08/2015 11:51   Mm Rt Plc Breast Loc Dev   1st Lesion  Inc Mammo Guide  Result Date: 12/08/2015 CLINICAL DATA:  Recent diagnosis of malignancy following cytologic evaluation of a cyst aspiration of the right breast. This cyst is at the 6 o'clock position posterior. A clip was placed, and a post clip  mammogram showed satisfactory position of the clip. The patient presents for wire localization of the biopsy clip. EXAM: NEEDLE LOCALIZATION OF THE RIGHT BREAST WITH MAMMO GUIDANCE COMPARISON:  Previous exams. FINDINGS: Patient presents for needle localization prior to excisional biopsy. I met with the patient and we discussed the procedure of needle localization including benefits and  alternatives. We discussed the high likelihood of a successful procedure. We discussed the risks of the procedure, including infection, bleeding, tissue injury, and further surgery. Informed, written consent was given. The usual time-out protocol was performed immediately prior to the procedure. Using mammographic guidance, sterile technique, 1% lidocaine and a 7 cm modified Kopans needle, a wing shaped biopsy clip was localized using an inferior approach. The images were marked for Dr. Tamala Julian. IMPRESSION: Needle localization right breast. No apparent complications. Electronically Signed   By: Curlene Dolphin M.D.   On: 12/08/2015 10:00    ASSESSMENT: Pathologic stage Ia poorly differentiated triple negative carcinoma of the lower outer quadrant of the right breast.  PLAN:    1. Pathologic stage Ia poorly differentiated triple negative carcinoma of the lower outer quadrant of the right breast:  Final pathology confirmed breast primary. Although patient is a stage I, have recommended adjuvant chemotherapy given her young age, the triple negative status of her disease, and tumor size greater than 5 mm. Previously, CT of the chest, abdomen, and pelvis did not reveal any metastatic disease. Return to clinic on January 14, 2016 to initiate cycle 1 of 4 of Taxotere plus Cytoxan. Plan to give 4 treatments every 3 weeks. Patient will not require Neulasta support, but will consider it for subsequent cycles if she has neutropenia. She does not require port placement at this time.  2. Genetic testing: Given patient's age and triple negative status of her tumor, will consider genetic testing in the future.Marland Kitchen  Approximately 30 minutes was spent in discussion of which greater than 50% was consultation.   Patient expressed understanding and was in agreement with this plan. She also understands that She can call clinic at any time with any questions, concerns, or complaints.   Breast cancer of lower-outer quadrant of  right female breast [facility])   Staging form: Breast, AJCC 7th Edition   - Clinical stage from 12/20/2015: Stage IA (T1b, N0, M0) - Signed by Lloyd Huger, MD on 12/20/2015  Lloyd Huger, MD   12/21/2015 6:03 PM

## 2015-12-21 ENCOUNTER — Inpatient Hospital Stay: Payer: 59 | Attending: Oncology | Admitting: Oncology

## 2015-12-21 VITALS — BP 134/92 | HR 72 | Temp 96.6°F | Resp 18 | Wt 177.1 lb

## 2015-12-21 DIAGNOSIS — Z171 Estrogen receptor negative status [ER-]: Secondary | ICD-10-CM | POA: Insufficient documentation

## 2015-12-21 DIAGNOSIS — Z79899 Other long term (current) drug therapy: Secondary | ICD-10-CM | POA: Insufficient documentation

## 2015-12-21 DIAGNOSIS — C50511 Malignant neoplasm of lower-outer quadrant of right female breast: Secondary | ICD-10-CM | POA: Diagnosis present

## 2015-12-21 DIAGNOSIS — K219 Gastro-esophageal reflux disease without esophagitis: Secondary | ICD-10-CM | POA: Insufficient documentation

## 2015-12-21 DIAGNOSIS — M1990 Unspecified osteoarthritis, unspecified site: Secondary | ICD-10-CM | POA: Diagnosis not present

## 2015-12-21 DIAGNOSIS — Z7982 Long term (current) use of aspirin: Secondary | ICD-10-CM | POA: Diagnosis not present

## 2015-12-21 MED ORDER — PROCHLORPERAZINE MALEATE 10 MG PO TABS: 10.0000 mg | ORAL_TABLET | Freq: Four times a day (QID) | ORAL | 1 refills | Status: DC | PRN

## 2015-12-21 MED ORDER — ONDANSETRON HCL 8 MG PO TABS: 8.0000 mg | ORAL_TABLET | Freq: Two times a day (BID) | ORAL | 1 refills | Status: DC | PRN

## 2015-12-21 NOTE — Progress Notes (Signed)
START OFF PATHWAY REGIMEN - Breast  Off Pathway: Docetaxel + Cyclophosphamide (TC)  OFF00004:Docetaxel + Cyclophosphamide (TC):   A cycle is every 21 days:     Docetaxel (Taxotere(R)) 75 mg/m2 in 250 mL NS IV over 1 hour, q21 days; followed by Dose Mod: None     Cyclophosphamide (Cytoxan(R)) 600 mg/m2 in 250 mL NS IV over 30 minutes, q21 days Dose Mod: None Additional Orders: Premedicate with dexamethasone 8 mg PO bid for three days beginning 1 day prior to therapy  **Always confirm dose/schedule in your pharmacy ordering system**    Patient Characteristics: Adjuvant Therapy, Node Negative, HER2/neu Negative/Unknown/Equivocal, ER Negative AJCC Stage Grouping: IA Current Disease Status: No Distant Mets or Local Recurrence AJCC M Stage: X ER Status: Negative (-) AJCC N Stage: X AJCC T Stage: X HER2/neu: Negative (-) PR Status: Negative (-) Node Status: Negative (-)  Intent of Therapy: Curative Intent, Discussed with Patient

## 2015-12-21 NOTE — Progress Notes (Signed)
States is having pain and tenderness around right lumpectomy site. Taking tylenol and advil to help tolerate pain.

## 2015-12-22 NOTE — Patient Instructions (Signed)

## 2015-12-23 ENCOUNTER — Ambulatory Visit: Payer: 59 | Admitting: Oncology

## 2015-12-24 ENCOUNTER — Inpatient Hospital Stay: Payer: 59

## 2015-12-29 ENCOUNTER — Other Ambulatory Visit: Payer: 59

## 2016-01-14 ENCOUNTER — Inpatient Hospital Stay: Payer: 59

## 2016-01-14 ENCOUNTER — Inpatient Hospital Stay (HOSPITAL_BASED_OUTPATIENT_CLINIC_OR_DEPARTMENT_OTHER): Payer: 59 | Admitting: Oncology

## 2016-01-14 ENCOUNTER — Inpatient Hospital Stay: Payer: 59 | Attending: Oncology

## 2016-01-14 VITALS — BP 138/71 | HR 93 | Temp 97.6°F | Resp 18 | Wt 176.1 lb

## 2016-01-14 VITALS — BP 115/78 | HR 62 | Resp 20

## 2016-01-14 DIAGNOSIS — Z171 Estrogen receptor negative status [ER-]: Secondary | ICD-10-CM | POA: Diagnosis not present

## 2016-01-14 DIAGNOSIS — K219 Gastro-esophageal reflux disease without esophagitis: Secondary | ICD-10-CM | POA: Diagnosis not present

## 2016-01-14 DIAGNOSIS — Z79899 Other long term (current) drug therapy: Secondary | ICD-10-CM | POA: Insufficient documentation

## 2016-01-14 DIAGNOSIS — Z7982 Long term (current) use of aspirin: Secondary | ICD-10-CM | POA: Insufficient documentation

## 2016-01-14 DIAGNOSIS — Z5111 Encounter for antineoplastic chemotherapy: Secondary | ICD-10-CM | POA: Insufficient documentation

## 2016-01-14 DIAGNOSIS — C50511 Malignant neoplasm of lower-outer quadrant of right female breast: Secondary | ICD-10-CM

## 2016-01-14 DIAGNOSIS — M1990 Unspecified osteoarthritis, unspecified site: Secondary | ICD-10-CM | POA: Diagnosis not present

## 2016-01-14 LAB — COMPREHENSIVE METABOLIC PANEL
ALT: 23 U/L (ref 14–54)
AST: 25 U/L (ref 15–41)
Albumin: 4.3 g/dL (ref 3.5–5.0)
Alkaline Phosphatase: 64 U/L (ref 38–126)
Anion gap: 5 (ref 5–15)
BUN: 22 mg/dL — ABNORMAL HIGH (ref 6–20)
CHLORIDE: 107 mmol/L (ref 101–111)
CO2: 28 mmol/L (ref 22–32)
CREATININE: 0.69 mg/dL (ref 0.44–1.00)
Calcium: 9.3 mg/dL (ref 8.9–10.3)
Glucose, Bld: 86 mg/dL (ref 65–99)
POTASSIUM: 3.9 mmol/L (ref 3.5–5.1)
Sodium: 140 mmol/L (ref 135–145)
Total Bilirubin: 0.4 mg/dL (ref 0.3–1.2)
Total Protein: 7.8 g/dL (ref 6.5–8.1)

## 2016-01-14 LAB — CBC WITH DIFFERENTIAL/PLATELET
Basophils Absolute: 0 10*3/uL (ref 0–0.1)
Basophils Relative: 1 %
EOS PCT: 1 %
Eosinophils Absolute: 0.1 10*3/uL (ref 0–0.7)
HCT: 40.6 % (ref 35.0–47.0)
Hemoglobin: 13.8 g/dL (ref 12.0–16.0)
LYMPHS ABS: 1.6 10*3/uL (ref 1.0–3.6)
LYMPHS PCT: 25 %
MCH: 29.2 pg (ref 26.0–34.0)
MCHC: 33.9 g/dL (ref 32.0–36.0)
MCV: 86.2 fL (ref 80.0–100.0)
MONO ABS: 0.5 10*3/uL (ref 0.2–0.9)
Monocytes Relative: 8 %
Neutro Abs: 4.2 10*3/uL (ref 1.4–6.5)
Neutrophils Relative %: 65 %
PLATELETS: 278 10*3/uL (ref 150–440)
RBC: 4.71 MIL/uL (ref 3.80–5.20)
RDW: 12.9 % (ref 11.5–14.5)
WBC: 6.3 10*3/uL (ref 3.6–11.0)

## 2016-01-14 MED ORDER — SODIUM CHLORIDE 0.9 % IV SOLN
Freq: Once | INTRAVENOUS | Status: AC
  Administered 2016-01-14: 11:00:00 via INTRAVENOUS
  Filled 2016-01-14: qty 1000

## 2016-01-14 MED ORDER — PALONOSETRON HCL INJECTION 0.25 MG/5ML
0.2500 mg | Freq: Once | INTRAVENOUS | Status: AC
  Administered 2016-01-14: 0.25 mg via INTRAVENOUS
  Filled 2016-01-14: qty 5

## 2016-01-14 MED ORDER — CYCLOPHOSPHAMIDE CHEMO INJECTION 1 GM
600.0000 mg/m2 | Freq: Once | INTRAMUSCULAR | Status: AC
  Administered 2016-01-14: 1160 mg via INTRAVENOUS
  Filled 2016-01-14: qty 50

## 2016-01-14 MED ORDER — SODIUM CHLORIDE 0.9 % IV SOLN: 10.0000 mg | Freq: Once | INTRAVENOUS | Status: DC

## 2016-01-14 MED ORDER — SODIUM CHLORIDE 0.9 % IV SOLN
75.0000 mg/m2 | Freq: Once | INTRAVENOUS | Status: AC
  Administered 2016-01-14: 140 mg via INTRAVENOUS
  Filled 2016-01-14: qty 14

## 2016-01-14 MED ORDER — DEXAMETHASONE SODIUM PHOSPHATE 10 MG/ML IJ SOLN
10.0000 mg | Freq: Once | INTRAMUSCULAR | Status: AC
  Administered 2016-01-14: 10 mg via INTRAVENOUS
  Filled 2016-01-14: qty 1

## 2016-01-14 NOTE — Progress Notes (Signed)
[place]  Telephone:(336) [phone] Fax:(336) [phone]  ID: Manfred Arch OB: 1965/10/08  MR#: [id number]  [id number]  Patient Care Team: Marinda Elk, MD as PCP - General (Physician Assistant)  CHIEF COMPLAINT: Pathologic stage Ia poorly differentiated triple negative carcinoma of the lower outer quadrant of the right breast.  INTERVAL HISTORY: Patient returns to clinic today for further evaluation and consideration of cycle 1 of Taxotere and Cytoxan.  She is anxious, but otherwise feels well. She has no neurologic complaints. She denies any recent fevers or illnesses. She has a good appetite and denies weight loss. She has no chest pain or shortness of breath. She denies any nausea, vomiting, constipation, or diarrhea. She has no urinary complaints. Patient offers no further specific complaints today.  REVIEW OF SYSTEMS:   Review of Systems  Constitutional: Negative.  Negative for fever, malaise/fatigue and weight loss.  Respiratory: Negative.  Negative for cough, hemoptysis and shortness of breath.   Cardiovascular: Negative.  Negative for chest pain and leg swelling.  Gastrointestinal: Negative for abdominal pain, blood in stool and melena.  Genitourinary: Negative.   Musculoskeletal: Negative.   Neurological: Negative for sensory change and weakness.  Psychiatric/Behavioral: The patient is nervous/anxious.     As per HPI. Otherwise, a complete review of systems is negative.  PAST MEDICAL HISTORY: Past Medical History:  Diagnosis Date  . Arthritis    in neck  . Breast cancer, right breast ([place]) 11/18/2015  . GERD (gastroesophageal reflux disease)   . Kidney stones   . Last menstrual period (LMP) > 10 days ago 1995    PAST SURGICAL HISTORY: Past Surgical History:  Procedure Laterality Date  . ABDOMINAL HYSTERECTOMY  1995   partial  . BREAST BIOPSY Right 2017  . BREAST EXCISIONAL BIOPSY Right 12/08/2015   lumpectomy  . COLONOSCOPY  2016    . PARTIAL MASTECTOMY WITH NEEDLE LOCALIZATION Right 12/08/2015   Procedure: PARTIAL MASTECTOMY WITH NEEDLE LOCALIZATION;  Surgeon: Leonie Green, MD;  Location: [facility] ORS;  Service: General;  Laterality: Right;  . SENTINEL NODE BIOPSY Right 12/08/2015   Procedure: SENTINEL NODE BIOPSY;  Surgeon: Leonie Green, MD;  Location: [facility] ORS;  Service: General;  Laterality: Right;    FAMILY HISTORY: Family History  Problem Relation Age of Onset  . Breast cancer Neg Hx     ADVANCED DIRECTIVES (Y/N):  N  HEALTH MAINTENANCE: Social History  Substance Use Topics  . Smoking status: Never Smoker  . Smokeless tobacco: Never Used  . Alcohol use No     Colonoscopy:  PAP:  Bone density:  Lipid panel:  No Known Allergies  Current Outpatient Prescriptions  Medication Sig Dispense Refill  . acetaminophen (TYLENOL) 325 MG tablet Take 2 tablets by mouth every 4 (four) hours as needed.    Marland Kitchen aspirin EC 81 MG tablet Take 1 tablet by mouth daily.    . carisoprodol (SOMA) 350 MG tablet Take 1 tablet by mouth 4 (four) times daily as needed.    Marland Kitchen ibuprofen (ADVIL,MOTRIN) 200 MG tablet Take 1 tablet by mouth as needed.    . meloxicam (MOBIC) 7.5 MG tablet Take 1 tablet by mouth daily as needed.    . Multiple Vitamin (MULTI-VITAMINS) TABS Take 1 tablet by mouth daily.    Marland Kitchen omeprazole (PRILOSEC) 20 MG capsule Take 1 capsule by mouth daily.    . OnabotulinumtoxinA (BOTOX IJ) Inject 1 Dose as directed every 3 (three) months.    . ondansetron (ZOFRAN) 8 MG  tablet Take 1 tablet (8 mg total) by mouth 2 (two) times daily as needed for refractory nausea / vomiting. 30 tablet 1  . prochlorperazine (COMPAZINE) 10 MG tablet Take 1 tablet (10 mg total) by mouth every 6 (six) hours as needed (Nausea or vomiting). 30 tablet 1   No current facility-administered medications for this visit.     OBJECTIVE: Vitals:   01/14/16 0921  BP: 138/71  Pulse: 93  Resp: 18  Temp: 97.6 F (36.4 C)     Body mass  index is 29.31 kg/m.    ECOG FS:0 - Asymptomatic  General: Well-developed, well-nourished, no acute distress. Eyes: Pink conjunctiva, anicteric sclera. Breasts: Well healing surgical scar on right breast.  Lungs: Clear to auscultation bilaterally. Heart: Regular rate and rhythm. No rubs, murmurs, or gallops. Abdomen: Soft, nontender, nondistended. No organomegaly noted, normoactive bowel sounds. Musculoskeletal: No edema, cyanosis, or clubbing. Neuro: Alert, answering all questions appropriately. Cranial nerves grossly intact. Skin: No rashes or petechiae noted. Psych: Normal affect.   LAB RESULTS:  Lab Results  Component Value Date   NA 140 01/14/2016   K 3.9 01/14/2016   CL 107 01/14/2016   CO2 28 01/14/2016   GLUCOSE 86 01/14/2016   BUN 22 (H) 01/14/2016   CREATININE 0.69 01/14/2016   CALCIUM 9.3 01/14/2016   PROT 7.8 01/14/2016   ALBUMIN 4.3 01/14/2016   AST 25 01/14/2016   ALT 23 01/14/2016   ALKPHOS 64 01/14/2016   BILITOT 0.4 01/14/2016   GFRNONAA >60 01/14/2016   GFRAA >60 01/14/2016    Lab Results  Component Value Date   WBC 6.3 01/14/2016   NEUTROABS 4.2 01/14/2016   HGB 13.8 01/14/2016   HCT 40.6 01/14/2016   MCV 86.2 01/14/2016   PLT 278 01/14/2016     STUDIES: No results found.  ASSESSMENT: Pathologic stage Ia poorly differentiated triple negative carcinoma of the lower outer quadrant of the right breast.  PLAN:    1. Pathologic stage Ia poorly differentiated triple negative carcinoma of the lower outer quadrant of the right breast:  Final pathology confirmed breast primary. Although patient is a stage I, have recommended adjuvant chemotherapy given her young age, the triple negative status of her disease, and tumor size greater than 5 mm. Previously, CT of the chest, abdomen, and pelvis did not reveal any metastatic disease. Proceed with cycle 1 of 4 of Taxotere plus Cytoxan today. Plan to give 4 treatments every 3 weeks. Patient will not require  Neulasta support, but will consider it for subsequent cycles if she has neutropenia. She does not require port placement at this time. Return to clinic in 1 week for laboratory work only and then in 3 weeks for consideration of cycle 2. 2. Genetic testing: Given patient's age and triple negative status of her tumor, will consider genetic testing in the future.  Approximately 30 minutes was spent in discussion of which greater than 50% was consultation.   Patient expressed understanding and was in agreement with this plan. She also understands that She can call clinic at any time with any questions, concerns, or complaints.   Breast cancer of lower-outer quadrant of right female breast [facility])   Staging form: Breast, AJCC 7th Edition   - Clinical stage from 12/20/2015: Stage IA (T1b, N0, M0) - Signed by Lloyd Huger, MD on 12/20/2015  Lloyd Huger, MD   01/17/2016 8:40 PM

## 2016-01-14 NOTE — Progress Notes (Signed)
States is feeling well. Having mild neck pain that is chronic.

## 2016-01-18 ENCOUNTER — Other Ambulatory Visit: Payer: Self-pay | Admitting: *Deleted

## 2016-01-18 ENCOUNTER — Telehealth: Payer: Self-pay | Admitting: *Deleted

## 2016-01-18 NOTE — Telephone Encounter (Signed)
Treatment dates faxed to [facility].

## 2016-01-18 NOTE — Telephone Encounter (Signed)
Ann from [facility] called to say they need the exact days of treatment from the doctors office. Evidently you faxed the treatment plan but it did not have specific dates for chemo treatment. Please re-fax to ANN [phone]

## 2016-01-21 ENCOUNTER — Other Ambulatory Visit: Payer: 59

## 2016-01-21 ENCOUNTER — Inpatient Hospital Stay: Payer: 59

## 2016-01-21 DIAGNOSIS — Z171 Estrogen receptor negative status [ER-]: Principal | ICD-10-CM

## 2016-01-21 DIAGNOSIS — C50511 Malignant neoplasm of lower-outer quadrant of right female breast: Secondary | ICD-10-CM

## 2016-01-21 LAB — CBC WITH DIFFERENTIAL/PLATELET
Basophils Absolute: 0 10*3/uL (ref 0–0.1)
Basophils Relative: 2 %
EOS PCT: 2 %
Eosinophils Absolute: 0 10*3/uL (ref 0–0.7)
HCT: 39.7 % (ref 35.0–47.0)
Hemoglobin: 13.2 g/dL (ref 12.0–16.0)
LYMPHS ABS: 0.8 10*3/uL — AB (ref 1.0–3.6)
LYMPHS PCT: 73 %
MCH: 28.7 pg (ref 26.0–34.0)
MCHC: 33.2 g/dL (ref 32.0–36.0)
MCV: 86.5 fL (ref 80.0–100.0)
MONO ABS: 0.1 10*3/uL — AB (ref 0.2–0.9)
MONOS PCT: 13 %
Neutro Abs: 0.1 10*3/uL — ABNORMAL LOW (ref 1.4–6.5)
Neutrophils Relative %: 10 %
PLATELETS: 268 10*3/uL (ref 150–440)
RBC: 4.59 MIL/uL (ref 3.80–5.20)
RDW: 12.7 % (ref 11.5–14.5)
WBC: 1.1 10*3/uL — CL (ref 3.6–11.0)

## 2016-01-21 LAB — COMPREHENSIVE METABOLIC PANEL
ALT: 34 U/L (ref 14–54)
AST: 28 U/L (ref 15–41)
Albumin: 4.2 g/dL (ref 3.5–5.0)
Alkaline Phosphatase: 64 U/L (ref 38–126)
Anion gap: 8 (ref 5–15)
BUN: 21 mg/dL — AB (ref 6–20)
CHLORIDE: 105 mmol/L (ref 101–111)
CO2: 26 mmol/L (ref 22–32)
CREATININE: 0.56 mg/dL (ref 0.44–1.00)
Calcium: 9.3 mg/dL (ref 8.9–10.3)
GFR calc Af Amer: >60 mL/min (ref >60)
Glucose, Bld: 93 mg/dL (ref 65–99)
Potassium: 3.9 mmol/L (ref 3.5–5.1)
Sodium: 139 mmol/L (ref 135–145)
Total Bilirubin: 0.5 mg/dL (ref 0.3–1.2)
Total Protein: 7.8 g/dL (ref 6.5–8.1)

## 2016-01-25 ENCOUNTER — Encounter: Payer: Self-pay | Admitting: *Deleted

## 2016-01-25 ENCOUNTER — Telehealth: Payer: Self-pay | Admitting: *Deleted

## 2016-01-25 NOTE — Telephone Encounter (Signed)
Probably related to the chemo.  She did not get neulasta.   Continue hydrocortisone cream and benadryl as needed. OTC saline nasal spray.

## 2016-01-25 NOTE — Telephone Encounter (Signed)
Reports that she is still feeling "bad, aching in bones, tired" Asking if this is normal Also reports that she has a rash on her hands for which she is using hydrocortisone cream and taking benadryl. Asking what she can use for dry nose. Please advise

## 2016-01-25 NOTE — Telephone Encounter (Signed)
Pt requests that letter be sent to disability company stated that she had to miss work due to diarrhea on 01/15/16 and 01/19/16. Letter has been faxed.

## 2016-01-25 NOTE — Telephone Encounter (Signed)
I spoke with patient and advised per Dr Virgel Manifold orders She repeated back to me and stated she is taking Tylenol for pain and asked if that is alright. She also asked about using Advil which I advised against and to not use more than recommended dose on Tylenol bottle. She acknowledged this and I told her to call back if her pain gets worse

## 2016-02-03 NOTE — Progress Notes (Signed)
[place]  [phone]) [phone] [phone]  ID: Melanie Campos OB: Oct 13, 1965  MR#: [id number]  [id number]  Patient Care Team: Marinda Elk, MD as PCP - General (Physician Assistant)  CHIEF COMPLAINT: Pathologic stage Ia poorly differentiated triple negative carcinoma of the lower outer quadrant of the right breast.  INTERVAL HISTORY: Patient returns to clinic today for further evaluation and consideration of cycle 2 of Taxotere and Cytoxan. She tolerated her first infusion well without significant side effects. She currently feels well and is asymptomatic. She does admit to increased constipation. She has no neurologic complaints. She denies any recent fevers or illnesses. She has a good appetite and denies weight loss. She has no chest pain or shortness of breath. She denies any nausea, vomiting, or diarrhea. She has no urinary complaints. Patient offers no further specific complaints today.  REVIEW OF SYSTEMS:   Review of Systems  Constitutional: Negative.  Negative for fever, malaise/fatigue and weight loss.  Respiratory: Negative.  Negative for cough, hemoptysis and shortness of breath.   Cardiovascular: Negative.  Negative for chest pain and leg swelling.  Gastrointestinal: Positive for constipation. Negative for abdominal pain, blood in stool and melena.  Genitourinary: Negative.   Musculoskeletal: Negative.   Neurological: Negative for sensory change and weakness.  Psychiatric/Behavioral: The patient is not nervous/anxious.     As per HPI. Otherwise, a complete review of systems is negative.  PAST MEDICAL HISTORY: Past Medical History:  Diagnosis Date  . Arthritis    in neck  . Breast cancer, right breast ([place]) 11/18/2015  . GERD (gastroesophageal reflux disease)   . Kidney stones   . Last menstrual period (LMP) > 10 days ago 1995    PAST SURGICAL HISTORY: Past Surgical History:  Procedure Laterality Date  . ABDOMINAL  HYSTERECTOMY  1995   partial  . BREAST BIOPSY Right 2017  . BREAST EXCISIONAL BIOPSY Right 12/08/2015   lumpectomy  . COLONOSCOPY  2016  . PARTIAL MASTECTOMY WITH NEEDLE LOCALIZATION Right 12/08/2015   Procedure: PARTIAL MASTECTOMY WITH NEEDLE LOCALIZATION;  Surgeon: Leonie Green, MD;  Location: [facility] ORS;  Service: General;  Laterality: Right;  . SENTINEL NODE BIOPSY Right 12/08/2015   Procedure: SENTINEL NODE BIOPSY;  Surgeon: Leonie Green, MD;  Location: [facility] ORS;  Service: General;  Laterality: Right;    FAMILY HISTORY: Family History  Problem Relation Age of Onset  . Breast cancer Neg Hx     ADVANCED DIRECTIVES (Y/N):  N  HEALTH MAINTENANCE: Social History  Substance Use Topics  . Smoking status: Never Smoker  . Smokeless tobacco: Never Used  . Alcohol use No     Colonoscopy:  PAP:  Bone density:  Lipid panel:  No Known Allergies  Current Outpatient Prescriptions  Medication Sig Dispense Refill  . acetaminophen (TYLENOL) 325 MG tablet Take 2 tablets by mouth every 4 (four) hours as needed.    Marland Kitchen aspirin EC 81 MG tablet Take 1 tablet by mouth daily.    . carisoprodol (SOMA) 350 MG tablet Take 1 tablet by mouth 4 (four) times daily as needed.    Marland Kitchen ibuprofen (ADVIL,MOTRIN) 200 MG tablet Take 1 tablet by mouth as needed.    . meloxicam (MOBIC) 7.5 MG tablet Take 1 tablet by mouth daily as needed.    . Multiple Vitamin (MULTI-VITAMINS) TABS Take 1 tablet by mouth daily.    Marland Kitchen omeprazole (PRILOSEC) 20 MG capsule Take 1 capsule by mouth daily.    . OnabotulinumtoxinA (BOTOX IJ)  Inject 1 Dose as directed every 3 (three) months.    . ondansetron (ZOFRAN) 8 MG tablet Take 1 tablet (8 mg total) by mouth 2 (two) times daily as needed for refractory nausea / vomiting. 30 tablet 1  . prochlorperazine (COMPAZINE) 10 MG tablet Take 1 tablet (10 mg total) by mouth every 6 (six) hours as needed (Nausea or vomiting). 30 tablet 1   No current facility-administered  medications for this visit.     OBJECTIVE: Vitals:   02/04/16 0925  BP: 125/85  Pulse: 80  Resp: 18  Temp: 98.1 F (36.7 C)     Body mass index is 29.37 kg/m.    ECOG FS:0 - Asymptomatic  General: Well-developed, well-nourished, no acute distress. Eyes: Pink conjunctiva, anicteric sclera. Breasts: Well healing surgical scar on right breast.  Lungs: Clear to auscultation bilaterally. Heart: Regular rate and rhythm. No rubs, murmurs, or gallops. Abdomen: Soft, nontender, nondistended. No organomegaly noted, normoactive bowel sounds. Musculoskeletal: No edema, cyanosis, or clubbing. Neuro: Alert, answering all questions appropriately. Cranial nerves grossly intact. Skin: No rashes or petechiae noted. Psych: Normal affect.   LAB RESULTS:  Lab Results  Component Value Date   NA 138 02/04/2016   K 3.9 02/04/2016   CL 104 02/04/2016   CO2 26 02/04/2016   GLUCOSE 88 02/04/2016   BUN 19 02/04/2016   CREATININE 0.60 02/04/2016   CALCIUM 9.3 02/04/2016   PROT 7.8 02/04/2016   ALBUMIN 4.3 02/04/2016   AST 22 02/04/2016   ALT 25 02/04/2016   ALKPHOS 67 02/04/2016   BILITOT 0.4 02/04/2016   GFRNONAA >60 02/04/2016   GFRAA >60 02/04/2016    Lab Results  Component Value Date   WBC 7.8 02/04/2016   NEUTROABS 5.8 02/04/2016   HGB 12.9 02/04/2016   HCT 38.1 02/04/2016   MCV 85.9 02/04/2016   PLT 373 02/04/2016     STUDIES: No results found.  ASSESSMENT: Pathologic stage Ia poorly differentiated triple negative carcinoma of the lower outer quadrant of the right breast.  PLAN:    1. Pathologic stage Ia poorly differentiated triple negative carcinoma of the lower outer quadrant of the right breast:  Final pathology confirmed breast primary. Although patient is a stage I, have recommended adjuvant chemotherapy given her young age, the triple negative status of her disease, and tumor size greater than 5 mm. Previously, CT of the chest, abdomen, and pelvis did not reveal any  metastatic disease. Proceed with cycle 2 of 4 of Taxotere plus Cytoxan today. Plan to give 4 treatments every 3 weeks. Will add in OnPro Neulasta support for the remainder her treatments. She does not require port placement at this time. Return to clinic in 3 weeks for consideration of cycle 3. 2. Genetic testing: Given patient's age and triple negative status of her tumor, will consider genetic testing in the future.  Approximately 30 minutes was spent in discussion of which greater than 50% was consultation.   Patient expressed understanding and was in agreement with this plan. She also understands that She can call clinic at any time with any questions, concerns, or complaints.   Breast cancer of lower-outer quadrant of right female breast [facility])   Staging form: Breast, AJCC 7th Edition   - Clinical stage from 12/20/2015: Stage IA (T1b, N0, M0) - Signed by Lloyd Huger, MD on 12/20/2015  Lloyd Huger, MD   02/04/2016 10:04 AM

## 2016-02-04 ENCOUNTER — Inpatient Hospital Stay (HOSPITAL_BASED_OUTPATIENT_CLINIC_OR_DEPARTMENT_OTHER): Payer: 59 | Admitting: Oncology

## 2016-02-04 ENCOUNTER — Inpatient Hospital Stay: Payer: 59

## 2016-02-04 VITALS — BP 125/85 | HR 80 | Temp 98.1°F | Resp 18 | Wt 176.5 lb

## 2016-02-04 DIAGNOSIS — C50511 Malignant neoplasm of lower-outer quadrant of right female breast: Secondary | ICD-10-CM

## 2016-02-04 DIAGNOSIS — Z171 Estrogen receptor negative status [ER-]: Secondary | ICD-10-CM | POA: Diagnosis not present

## 2016-02-04 DIAGNOSIS — Z79899 Other long term (current) drug therapy: Secondary | ICD-10-CM | POA: Diagnosis not present

## 2016-02-04 LAB — COMPREHENSIVE METABOLIC PANEL
ALK PHOS: 67 U/L (ref 38–126)
ALT: 25 U/L (ref 14–54)
ANION GAP: 8 (ref 5–15)
AST: 22 U/L (ref 15–41)
Albumin: 4.3 g/dL (ref 3.5–5.0)
BILIRUBIN TOTAL: 0.4 mg/dL (ref 0.3–1.2)
BUN: 19 mg/dL (ref 6–20)
CALCIUM: 9.3 mg/dL (ref 8.9–10.3)
CO2: 26 mmol/L (ref 22–32)
Chloride: 104 mmol/L (ref 101–111)
Creatinine, Ser: 0.6 mg/dL (ref 0.44–1.00)
Glucose, Bld: 88 mg/dL (ref 65–99)
Potassium: 3.9 mmol/L (ref 3.5–5.1)
SODIUM: 138 mmol/L (ref 135–145)
TOTAL PROTEIN: 7.8 g/dL (ref 6.5–8.1)

## 2016-02-04 LAB — CBC WITH DIFFERENTIAL/PLATELET
Basophils Absolute: 0.1 10*3/uL (ref 0–0.1)
Basophils Relative: 1 %
EOS ABS: 0 10*3/uL (ref 0–0.7)
Eosinophils Relative: 0 %
HCT: 38.1 % (ref 35.0–47.0)
HEMOGLOBIN: 12.9 g/dL (ref 12.0–16.0)
LYMPHS ABS: 1.4 10*3/uL (ref 1.0–3.6)
Lymphocytes Relative: 18 %
MCH: 29.2 pg (ref 26.0–34.0)
MCHC: 34 g/dL (ref 32.0–36.0)
MCV: 85.9 fL (ref 80.0–100.0)
MONOS PCT: 7 %
Monocytes Absolute: 0.6 10*3/uL (ref 0.2–0.9)
NEUTROS PCT: 74 %
Neutro Abs: 5.8 10*3/uL (ref 1.4–6.5)
Platelets: 373 10*3/uL (ref 150–440)
RBC: 4.43 MIL/uL (ref 3.80–5.20)
RDW: 13.1 % (ref 11.5–14.5)
WBC: 7.8 10*3/uL (ref 3.6–11.0)

## 2016-02-04 MED ORDER — SODIUM CHLORIDE 0.9 % IV SOLN
75.0000 mg/m2 | Freq: Once | INTRAVENOUS | Status: AC
  Administered 2016-02-04: 140 mg via INTRAVENOUS
  Filled 2016-02-04: qty 14

## 2016-02-04 MED ORDER — SODIUM CHLORIDE 0.9 % IV SOLN: 10.0000 mg | Freq: Once | INTRAVENOUS | Status: DC

## 2016-02-04 MED ORDER — PEGFILGRASTIM 6 MG/0.6ML ~~LOC~~ PSKT
6.0000 mg | PREFILLED_SYRINGE | Freq: Once | SUBCUTANEOUS | Status: AC
  Administered 2016-02-04: 6 mg via SUBCUTANEOUS
  Filled 2016-02-04: qty 0.6

## 2016-02-04 MED ORDER — SODIUM CHLORIDE 0.9 % IV SOLN
600.0000 mg/m2 | Freq: Once | INTRAVENOUS | Status: AC
  Administered 2016-02-04: 1160 mg via INTRAVENOUS
  Filled 2016-02-04: qty 50

## 2016-02-04 MED ORDER — PALONOSETRON HCL INJECTION 0.25 MG/5ML
0.2500 mg | Freq: Once | INTRAVENOUS | Status: AC
  Administered 2016-02-04: 0.25 mg via INTRAVENOUS
  Filled 2016-02-04: qty 5

## 2016-02-04 MED ORDER — DEXAMETHASONE SODIUM PHOSPHATE 10 MG/ML IJ SOLN
10.0000 mg | Freq: Once | INTRAMUSCULAR | Status: AC
  Administered 2016-02-04: 10 mg via INTRAVENOUS
  Filled 2016-02-04: qty 1

## 2016-02-04 MED ORDER — HEPARIN SOD (PORK) LOCK FLUSH 100 UNIT/ML IV SOLN
500.0000 [IU] | Freq: Once | INTRAVENOUS | Status: DC | PRN
  Filled 2016-02-04: qty 5

## 2016-02-04 MED ORDER — SODIUM CHLORIDE 0.9 % IV SOLN
Freq: Once | INTRAVENOUS | Status: AC
  Administered 2016-02-04: 11:00:00 via INTRAVENOUS
  Filled 2016-02-04: qty 1000

## 2016-02-04 NOTE — Progress Notes (Signed)
Complains of constipation and would like prescription strength laxative to take after treatments.

## 2016-02-08 ENCOUNTER — Encounter: Payer: Self-pay | Admitting: *Deleted

## 2016-02-09 ENCOUNTER — Telehealth: Payer: Self-pay | Admitting: *Deleted

## 2016-02-09 NOTE — Telephone Encounter (Signed)
Pt questions if can get dental work done since lost crown this morning. Per Dr. Grayland Ormond, pt may get dental work done the week of chemo treatment which is on 12/21. Pt will need to inform us of when dental appt is so we can schedule pt for labs the day before or day of. Left voicemail with pt. Instructed to call back if has further instructions.

## 2016-02-10 NOTE — Telephone Encounter (Signed)
Pt has appt scheduled with dentist in the afternoon on 02/25/16 after treatment.

## 2016-02-24 NOTE — Progress Notes (Signed)
[place]  Telephone:(336) [phone] [phone]) [phone]  ID: Melanie Campos OB: 06-22-1965  MR#: [id number]  GGY#:[id number]  Patient Care Team: Marinda Elk, MD as PCP - General (Physician Assistant)  CHIEF COMPLAINT: Pathologic stage Ia poorly differentiated triple negative carcinoma of the lower outer quadrant of the right breast.  INTERVAL HISTORY: Patient returns to clinic today for further evaluation and consideration of cycle 3 of Taxotere and Cytoxan. She tolerated her first two infusion well with only mild side effects. Her main complaint has been constipation that is resolved with stool softeners and laxatives. She recently tried mag citrate that she states works the best. She only uses it a few days after treatment. She currently feels well and is asymptomatic. She has no neurologic complaints. She denies any recent fevers or illnesses. She has a good appetite and denies weight loss. She has no chest pain or shortness of breath. She denies any nausea, vomiting, or diarrhea. She has no urinary complaints. Patient offers no further specific complaints today.  REVIEW OF SYSTEMS:   Review of Systems  Constitutional: Negative.  Negative for fever, malaise/fatigue and weight loss.  Respiratory: Negative.  Negative for cough, hemoptysis and shortness of breath.   Cardiovascular: Negative.  Negative for chest pain and leg swelling.  Gastrointestinal: Positive for constipation. Negative for abdominal pain, blood in stool and melena.  Genitourinary: Negative.   Musculoskeletal: Negative.   Neurological: Negative for sensory change and weakness.  Psychiatric/Behavioral: The patient is not nervous/anxious.     As per HPI. Otherwise, a complete review of systems is negative.  PAST MEDICAL HISTORY: Past Medical History:  Diagnosis Date  . Arthritis    in neck  . Breast cancer, right breast ([place]) 11/18/2015  . GERD (gastroesophageal reflux disease)   . Kidney  stones   . Last menstrual period (LMP) > 10 days ago 1995    PAST SURGICAL HISTORY: Past Surgical History:  Procedure Laterality Date  . ABDOMINAL HYSTERECTOMY  1995   partial  . BREAST BIOPSY Right 2017  . BREAST EXCISIONAL BIOPSY Right 12/08/2015   lumpectomy  . COLONOSCOPY  2016  . PARTIAL MASTECTOMY WITH NEEDLE LOCALIZATION Right 12/08/2015   Procedure: PARTIAL MASTECTOMY WITH NEEDLE LOCALIZATION;  Surgeon: Leonie Green, MD;  Location: [facility] ORS;  Service: General;  Laterality: Right;  . SENTINEL NODE BIOPSY Right 12/08/2015   Procedure: SENTINEL NODE BIOPSY;  Surgeon: Leonie Green, MD;  Location: [facility] ORS;  Service: General;  Laterality: Right;    FAMILY HISTORY: Family History  Problem Relation Age of Onset  . Breast cancer Neg Hx     ADVANCED DIRECTIVES (Y/N):  N  HEALTH MAINTENANCE: Social History  Substance Use Topics  . Smoking status: Never Smoker  . Smokeless tobacco: Never Used  . Alcohol use No     Colonoscopy:  PAP:  Bone density:  Lipid panel:  No Known Allergies  Current Outpatient Prescriptions  Medication Sig Dispense Refill  . acetaminophen (TYLENOL) 325 MG tablet Take 2 tablets by mouth every 4 (four) hours as needed.    Marland Kitchen aspirin EC 81 MG tablet Take 1 tablet by mouth daily.    . carisoprodol (SOMA) 350 MG tablet Take 1 tablet by mouth 4 (four) times daily as needed.    Marland Kitchen ibuprofen (ADVIL,MOTRIN) 200 MG tablet Take 1 tablet by mouth as needed.    . meloxicam (MOBIC) 7.5 MG tablet Take 1 tablet by mouth daily as needed.    . Multiple Vitamin (  MULTI-VITAMINS) TABS Take 1 tablet by mouth daily.    Marland Kitchen omeprazole (PRILOSEC) 20 MG capsule Take 1 capsule by mouth daily.    . OnabotulinumtoxinA (BOTOX IJ) Inject 1 Dose as directed every 3 (three) months.    . ondansetron (ZOFRAN) 8 MG tablet Take 1 tablet (8 mg total) by mouth 2 (two) times daily as needed for refractory nausea / vomiting. 30 tablet 1  . prochlorperazine (COMPAZINE) 10 MG  tablet Take 1 tablet (10 mg total) by mouth every 6 (six) hours as needed (Nausea or vomiting). 30 tablet 1   No current facility-administered medications for this visit.    Facility-Administered Medications Ordered in Other Visits  Medication Dose Route Frequency Provider Last Rate Last Dose  . [COMPLETED] pegfilgrastim (NEULASTA ONPRO KIT) injection 6 mg  6 mg Subcutaneous Once Lloyd Huger, MD   6 mg at 02/25/16 1240    OBJECTIVE: Vitals:   02/25/16 0906  BP: 136/83  Pulse: 87  Resp: 18  Temp: (!) 96.9 F (36.1 C)     Body mass index is 29.48 kg/m.    ECOG FS:0 - Asymptomatic  General: Well-developed, well-nourished, no acute distress. Eyes: Pink conjunctiva, anicteric sclera. Breasts: Well healing surgical scar on right breast.  Lungs: Clear to auscultation bilaterally. Heart: Regular rate and rhythm. No rubs, murmurs, or gallops. Abdomen: Soft, nontender, nondistended. No organomegaly noted, normoactive bowel sounds. Musculoskeletal: No edema, cyanosis, or clubbing. Neuro: Alert, answering all questions appropriately. Cranial nerves grossly intact. Skin: No rashes or petechiae noted. Psych: Normal affect.   LAB RESULTS:  Lab Results  Component Value Date   NA 138 02/25/2016   K 4.1 02/25/2016   CL 106 02/25/2016   CO2 28 02/25/2016   GLUCOSE 88 02/25/2016   BUN 17 02/25/2016   CREATININE 0.53 02/25/2016   CALCIUM 9.1 02/25/2016   PROT 7.2 02/25/2016   ALBUMIN 3.7 02/25/2016   AST 24 02/25/2016   ALT 26 02/25/2016   ALKPHOS 56 02/25/2016   BILITOT 0.5 02/25/2016   GFRNONAA >60 02/25/2016   GFRAA >60 02/25/2016    Lab Results  Component Value Date   WBC 6.7 02/25/2016   NEUTROABS 4.8 02/25/2016   HGB 12.2 02/25/2016   HCT 36.0 02/25/2016   MCV 86.7 02/25/2016   PLT 332 02/25/2016     STUDIES: No results found.  ASSESSMENT: Pathologic stage Ia poorly differentiated triple negative carcinoma of the lower outer quadrant of the right  breast.  PLAN:    1. Pathologic stage Ia poorly differentiated triple negative carcinoma of the lower outer quadrant of the right breast:  Final pathology confirmed breast primary. Although patient is a stage I, have recommended adjuvant chemotherapy given her young age, the triple negative status of her disease, and tumor size greater than 5 mm. Previously, CT of the chest, abdomen, and pelvis did not reveal any metastatic disease. Proceed with cycle 3 of 4 of Taxotere plus Cytoxan today. Plan to give 4 treatments every 3 weeks. Continue OnPro Neulasta support for the remainder her treatments. She does not require port placement at this time. Return to clinic in 3 weeks for consideration of cycle 4.  2. Genetic testing: Given patient's age and triple negative status of her tumor, will consider genetic testing in the future. 3. Constipation: Continue OTC treatments as indicated.  Approximately 30 minutes was spent in discussion of which greater than 50% was consultation.   Patient expressed understanding and was in agreement with this plan. She also understands  that She can call clinic at any time with any questions, concerns, or complaints.   Breast cancer of lower-outer quadrant of right female breast [facility])   Staging form: Breast, AJCC 7th Edition   - Clinical stage from 12/20/2015: Stage IA (T1b, N0, M0) - Signed by Lloyd Huger, MD on 12/20/2015  Faythe Casa, NP 02/25/2016  Patient was seen and evaluated independently and I agree with the assessment and plan as dictated above.  Lloyd Huger, MD 02/28/16 8:10 AM

## 2016-02-25 ENCOUNTER — Inpatient Hospital Stay: Payer: 59

## 2016-02-25 ENCOUNTER — Inpatient Hospital Stay (HOSPITAL_BASED_OUTPATIENT_CLINIC_OR_DEPARTMENT_OTHER): Payer: 59 | Admitting: Oncology

## 2016-02-25 ENCOUNTER — Inpatient Hospital Stay: Payer: 59 | Attending: Oncology

## 2016-02-25 ENCOUNTER — Encounter: Payer: Self-pay | Admitting: *Deleted

## 2016-02-25 VITALS — BP 136/83 | HR 87 | Temp 96.9°F | Resp 18 | Wt 177.1 lb

## 2016-02-25 DIAGNOSIS — K5900 Constipation, unspecified: Secondary | ICD-10-CM | POA: Diagnosis not present

## 2016-02-25 DIAGNOSIS — Z79899 Other long term (current) drug therapy: Secondary | ICD-10-CM | POA: Insufficient documentation

## 2016-02-25 DIAGNOSIS — C50511 Malignant neoplasm of lower-outer quadrant of right female breast: Secondary | ICD-10-CM

## 2016-02-25 DIAGNOSIS — Z5111 Encounter for antineoplastic chemotherapy: Secondary | ICD-10-CM | POA: Insufficient documentation

## 2016-02-25 DIAGNOSIS — Z171 Estrogen receptor negative status [ER-]: Secondary | ICD-10-CM | POA: Diagnosis not present

## 2016-02-25 DIAGNOSIS — K219 Gastro-esophageal reflux disease without esophagitis: Secondary | ICD-10-CM | POA: Insufficient documentation

## 2016-02-25 DIAGNOSIS — Z87442 Personal history of urinary calculi: Secondary | ICD-10-CM | POA: Insufficient documentation

## 2016-02-25 DIAGNOSIS — M1990 Unspecified osteoarthritis, unspecified site: Secondary | ICD-10-CM | POA: Diagnosis not present

## 2016-02-25 DIAGNOSIS — Z7982 Long term (current) use of aspirin: Secondary | ICD-10-CM | POA: Diagnosis not present

## 2016-02-25 LAB — COMPREHENSIVE METABOLIC PANEL
ALT: 26 U/L (ref 14–54)
AST: 24 U/L (ref 15–41)
Albumin: 3.7 g/dL (ref 3.5–5.0)
Alkaline Phosphatase: 56 U/L (ref 38–126)
Anion gap: 4 — ABNORMAL LOW (ref 5–15)
BILIRUBIN TOTAL: 0.5 mg/dL (ref 0.3–1.2)
BUN: 17 mg/dL (ref 6–20)
CHLORIDE: 106 mmol/L (ref 101–111)
CO2: 28 mmol/L (ref 22–32)
Calcium: 9.1 mg/dL (ref 8.9–10.3)
Creatinine, Ser: 0.53 mg/dL (ref 0.44–1.00)
Glucose, Bld: 88 mg/dL (ref 65–99)
POTASSIUM: 4.1 mmol/L (ref 3.5–5.1)
Sodium: 138 mmol/L (ref 135–145)
TOTAL PROTEIN: 7.2 g/dL (ref 6.5–8.1)

## 2016-02-25 LAB — CBC WITH DIFFERENTIAL/PLATELET
BASOS ABS: 0 10*3/uL (ref 0–0.1)
Basophils Relative: 1 %
EOS PCT: 0 %
Eosinophils Absolute: 0 10*3/uL (ref 0–0.7)
HEMATOCRIT: 36 % (ref 35.0–47.0)
Hemoglobin: 12.2 g/dL (ref 12.0–16.0)
LYMPHS ABS: 0.9 10*3/uL — AB (ref 1.0–3.6)
LYMPHS PCT: 13 %
MCH: 29.3 pg (ref 26.0–34.0)
MCHC: 33.8 g/dL (ref 32.0–36.0)
MCV: 86.7 fL (ref 80.0–100.0)
MONO ABS: 0.9 10*3/uL (ref 0.2–0.9)
MONOS PCT: 14 %
NEUTROS ABS: 4.8 10*3/uL (ref 1.4–6.5)
Neutrophils Relative %: 72 %
PLATELETS: 332 10*3/uL (ref 150–440)
RBC: 4.15 MIL/uL (ref 3.80–5.20)
RDW: 14.6 % — AB (ref 11.5–14.5)
WBC: 6.7 10*3/uL (ref 3.6–11.0)

## 2016-02-25 MED ORDER — PALONOSETRON HCL INJECTION 0.25 MG/5ML
0.2500 mg | Freq: Once | INTRAVENOUS | Status: AC
  Administered 2016-02-25: 0.25 mg via INTRAVENOUS
  Filled 2016-02-25: qty 5

## 2016-02-25 MED ORDER — SODIUM CHLORIDE 0.9 % IV SOLN
Freq: Once | INTRAVENOUS | Status: AC
Start: 2016-02-25 — End: 2016-02-25
  Administered 2016-02-25: 10:00:00 via INTRAVENOUS
  Filled 2016-02-25: qty 1000

## 2016-02-25 MED ORDER — PEGFILGRASTIM 6 MG/0.6ML ~~LOC~~ PSKT
6.0000 mg | PREFILLED_SYRINGE | Freq: Once | SUBCUTANEOUS | Status: AC
  Administered 2016-02-25: 6 mg via SUBCUTANEOUS
  Filled 2016-02-25: qty 0.6

## 2016-02-25 MED ORDER — DOCETAXEL CHEMO INJECTION 160 MG/16ML
75.0000 mg/m2 | Freq: Once | INTRAVENOUS | Status: AC
  Administered 2016-02-25: 140 mg via INTRAVENOUS
  Filled 2016-02-25: qty 14

## 2016-02-25 MED ORDER — SODIUM CHLORIDE 0.9 % IV SOLN
600.0000 mg/m2 | Freq: Once | INTRAVENOUS | Status: AC
  Administered 2016-02-25: 1160 mg via INTRAVENOUS
  Filled 2016-02-25: qty 50

## 2016-02-25 MED ORDER — DEXAMETHASONE SODIUM PHOSPHATE 10 MG/ML IJ SOLN
10.0000 mg | Freq: Once | INTRAMUSCULAR | Status: AC
  Administered 2016-02-25: 10 mg via INTRAVENOUS
  Filled 2016-02-25: qty 1

## 2016-02-25 NOTE — Progress Notes (Signed)
  Oncology Nurse Navigator Documentation  Navigator Location: [facility] (02/25/16 1400)   )Navigator Encounter Type: Clinic/MDC (02/25/16 1400)    Met with patient and her friend today prior to her chemotherapy.  States she is doing well.  No needs.  She is to call if she has any questions or needs.      Time Spent with Patient: 15 (02/25/16 1400)

## 2016-02-25 NOTE — Progress Notes (Signed)
Offers no complaints  

## 2016-02-27 ENCOUNTER — Encounter: Payer: Self-pay | Admitting: Emergency Medicine

## 2016-02-27 ENCOUNTER — Emergency Department
Admission: EM | Admit: 2016-02-27 | Discharge: 2016-02-28 | Disposition: A | Discharge status: Home / Self Care | Payer: 59 | Attending: Emergency Medicine | Admitting: Emergency Medicine

## 2016-02-27 DIAGNOSIS — K5909 Other constipation: Secondary | ICD-10-CM | POA: Diagnosis not present

## 2016-02-27 DIAGNOSIS — K5900 Constipation, unspecified: Secondary | ICD-10-CM | POA: Diagnosis present

## 2016-02-27 DIAGNOSIS — Z79899 Other long term (current) drug therapy: Secondary | ICD-10-CM | POA: Diagnosis not present

## 2016-02-27 DIAGNOSIS — Z853 Personal history of malignant neoplasm of breast: Secondary | ICD-10-CM | POA: Insufficient documentation

## 2016-02-27 DIAGNOSIS — Z7982 Long term (current) use of aspirin: Secondary | ICD-10-CM | POA: Insufficient documentation

## 2016-02-27 NOTE — ED Triage Notes (Signed)
Pt states that she has had two enemas today, two suppositories, milk of magnesia, and Murelax. Pt states that she has not had a bowel movement since Thursday. Pt is a chemo pt and had chemo on Thursday. Pt is in NAD at this time.

## 2016-02-28 ENCOUNTER — Emergency Department: Payer: 59

## 2016-02-28 LAB — COMPREHENSIVE METABOLIC PANEL
ALK PHOS: 65 U/L (ref 38–126)
ALT: 29 U/L (ref 14–54)
ANION GAP: 6 (ref 5–15)
AST: 24 U/L (ref 15–41)
Albumin: 3.4 g/dL — ABNORMAL LOW (ref 3.5–5.0)
BUN: 15 mg/dL (ref 6–20)
CHLORIDE: 107 mmol/L (ref 101–111)
CO2: 26 mmol/L (ref 22–32)
CREATININE: 0.51 mg/dL (ref 0.44–1.00)
Calcium: 8.2 mg/dL — ABNORMAL LOW (ref 8.9–10.3)
Glucose, Bld: 96 mg/dL (ref 65–99)
Potassium: 3.7 mmol/L (ref 3.5–5.1)
SODIUM: 139 mmol/L (ref 135–145)
TOTAL PROTEIN: 6.1 g/dL — AB (ref 6.5–8.1)
Total Bilirubin: 0.4 mg/dL (ref 0.3–1.2)

## 2016-02-28 LAB — CBC
HEMATOCRIT: 33.9 % — AB (ref 35.0–47.0)
HEMOGLOBIN: 11.2 g/dL — AB (ref 12.0–16.0)
MCH: 28.9 pg (ref 26.0–34.0)
MCHC: 33.1 g/dL (ref 32.0–36.0)
MCV: 87.3 fL (ref 80.0–100.0)
Platelets: 278 10*3/uL (ref 150–440)
RBC: 3.88 MIL/uL (ref 3.80–5.20)
RDW: 15.7 % — ABNORMAL HIGH (ref 11.5–14.5)
WBC: 27.4 10*3/uL — ABNORMAL HIGH (ref 3.6–11.0)

## 2016-02-28 MED ORDER — MAGNESIUM CITRATE PO SOLN
1.0000 | Freq: Once | ORAL | Status: AC
  Administered 2016-02-28: 1 via ORAL
  Filled 2016-02-28: qty 296

## 2016-02-28 MED ORDER — LACTULOSE 10 GM/15ML PO SOLN
30.0000 g | Freq: Once | ORAL | Status: AC
  Administered 2016-02-28: 30 g via ORAL
  Filled 2016-02-28: qty 60

## 2016-02-28 MED ORDER — LACTULOSE 10 GM/15ML PO SOLN: 30.0000 g | Freq: Two times a day (BID) | ORAL | 0 refills | Status: DC | PRN

## 2016-02-28 NOTE — ED Notes (Signed)
While doing hourly rounding, witnessed the pt dressed out of hospital attire and into street clothes; pt states to this nurse that she is ready to go home after getting no relief for her constipation.  This nurse convinced the pt stay until MD came in and spoke with her.  MD Dahlia Client made aware

## 2016-02-28 NOTE — ED Provider Notes (Signed)
[facility] Emergency Department Provider Note   ____________________________________________   First MD Initiated Contact with Patient 02/28/16 0002     (approximate)  I have reviewed the triage vital signs and the nursing notes.   HISTORY  Chief Complaint Constipation    HPI Melanie Campos is a 50 y.o. female who comes into the hospital today with some constipation. The patient had chemotherapy on Thursday and reports that her last bowel movement was on Thursday morning. The patient does have a history of constipation but reports that with her last 2 chemotherapy treatments she did have constipation like this. The patient reports that she tried to take a stool softener, and glaucoma suppository, MiraLAX and milk of magnesia at all today but nothing has helped. The patient reports that she feels like she needs to go but she is blocked. The patient has some left upper abdominal pain and she rates it about a 7 out of 10 in intensity. She denies any vomiting but did have some nausea. She denies any fevers. The patient is here today for evaluation.    Past Medical History:  Diagnosis Date  . Arthritis    in neck  . Breast cancer, right breast ([place]) 11/18/2015  . GERD (gastroesophageal reflux disease)   . Kidney stones   . Last menstrual period (LMP) > 10 days ago 1995    Patient Active Problem List   Diagnosis Date Noted  . Breast cancer of lower-outer quadrant of right female breast ([place]) 11/18/2015    Past Surgical History:  Procedure Laterality Date  . ABDOMINAL HYSTERECTOMY  1995   partial  . BREAST BIOPSY Right 2017  . BREAST EXCISIONAL BIOPSY Right 12/08/2015   lumpectomy  . COLONOSCOPY  2016  . PARTIAL MASTECTOMY WITH NEEDLE LOCALIZATION Right 12/08/2015   Procedure: PARTIAL MASTECTOMY WITH NEEDLE LOCALIZATION;  Surgeon: Leonie Green, MD;  Location: [facility] ORS;  Service: General;  Laterality: Right;  . SENTINEL NODE BIOPSY Right  12/08/2015   Procedure: SENTINEL NODE BIOPSY;  Surgeon: Leonie Green, MD;  Location: [facility] ORS;  Service: General;  Laterality: Right;    Prior to Admission medications   Medication Sig Start Date End Date Taking? Authorizing Provider  acetaminophen (TYLENOL) 325 MG tablet Take 2 tablets by mouth every 4 (four) hours as needed.    Historical Provider, MD  aspirin EC 81 MG tablet Take 1 tablet by mouth daily.    Historical Provider, MD  carisoprodol (SOMA) 350 MG tablet Take 1 tablet by mouth 4 (four) times daily as needed. 09/02/14   Historical Provider, MD  ibuprofen (ADVIL,MOTRIN) 200 MG tablet Take 1 tablet by mouth as needed.    Historical Provider, MD  lactulose (CHRONULAC) 10 GM/15ML solution Take 45 mLs (30 g total) by mouth 2 (two) times daily as needed for mild constipation or moderate constipation. 02/28/16   Loney Hering, MD  meloxicam (MOBIC) 7.5 MG tablet Take 1 tablet by mouth daily as needed. 09/02/14   Historical Provider, MD  Multiple Vitamin (MULTI-VITAMINS) TABS Take 1 tablet by mouth daily.    Historical Provider, MD  omeprazole (PRILOSEC) 20 MG capsule Take 1 capsule by mouth daily. 07/29/15   Historical Provider, MD  OnabotulinumtoxinA (BOTOX IJ) Inject 1 Dose as directed every 3 (three) months.    Historical Provider, MD  ondansetron (ZOFRAN) 8 MG tablet Take 1 tablet (8 mg total) by mouth 2 (two) times daily as needed for refractory nausea / vomiting. 12/21/15  Lloyd Huger, MD  prochlorperazine (COMPAZINE) 10 MG tablet Take 1 tablet (10 mg total) by mouth every 6 (six) hours as needed (Nausea or vomiting). 12/21/15   Lloyd Huger, MD    Allergies Patient has no known allergies.  Family History  Problem Relation Age of Onset  . Breast cancer Neg Hx     Social History Social History  Substance Use Topics  . Smoking status: Never Smoker  . Smokeless tobacco: Never Used  . Alcohol use No    Review of Systems Constitutional: No  fever/chills Eyes: No visual changes. ENT: No sore throat. Cardiovascular: Denies chest pain. Respiratory: Denies shortness of breath. Gastrointestinal:  abdominal pain.  Nausea, and constipation, no vomiting.  No diarrhea.  Genitourinary: Negative for dysuria. Musculoskeletal: Negative for back pain. Skin: Negative for rash. Neurological: Negative for headaches, focal weakness or numbness.  10-point ROS otherwise negative.  ____________________________________________   PHYSICAL EXAM:  VITAL SIGNS: ED Triage Vitals  Enc Vitals Group     BP 02/27/16 2138 122/75     Pulse Rate 02/27/16 2138 (!) 102     Resp 02/27/16 2138 18     Temp 02/27/16 2138 98.6 F (37 C)     Temp Source 02/27/16 2138 Oral     SpO2 02/27/16 2138 94 %     Weight 02/27/16 2138 176 lb (79.8 kg)     Height 02/27/16 2138 5\' 5"  (1.651 m)     Head Circumference --      Peak Flow --      Pain Score 02/27/16 2146 6     Pain Loc --      Pain Edu? --      Excl. in Between? --     Constitutional: Alert and oriented. Well appearing and in mild distress. Eyes: Conjunctivae are normal. PERRL. EOMI. Head: Atraumatic. Nose: No congestion/rhinnorhea. Mouth/Throat: Mucous membranes are moist.  Oropharynx non-erythematous. Cardiovascular: Normal rate, regular rhythm. Grossly normal heart sounds.  Good peripheral circulation. Respiratory: Normal respiratory effort.  No retractions. Lungs CTAB. Gastrointestinal: Soft and nontender. No distention. Positive bowel sounds Musculoskeletal: No lower extremity tenderness nor edema.   Neurologic:  Normal speech and language.  Skin:  Skin is warm, dry and intact.  Psychiatric: Mood and affect are normal. Speech and behavior are normal.  ____________________________________________   LABS (all labs ordered are listed, but only abnormal results are displayed)  Labs Reviewed  CBC - Abnormal; Notable for the following:       Result Value   WBC 27.4 (*)    Hemoglobin 11.2 (*)     HCT 33.9 (*)    RDW 15.7 (*)    All other components within normal limits  COMPREHENSIVE METABOLIC PANEL - Abnormal; Notable for the following:    Calcium 8.2 (*)    Total Protein 6.1 (*)    Albumin 3.4 (*)    All other components within normal limits   ____________________________________________  EKG  none ____________________________________________  RADIOLOGY  KUB ____________________________________________   PROCEDURES  Procedure(s) performed: None  Procedures  Critical Care performed: No  ____________________________________________   INITIAL IMPRESSION / ASSESSMENT AND PLAN / ED COURSE  Pertinent labs & imaging results that were available during my care of the patient were reviewed by me and considered in my medical decision making (see chart for details).  This is a 50 year old female who comes into the hospital today with some constipation and abdominal pain. The patient reports that she's had this before but she  is unable to use the bathroom. She reports that over the counter medications usually don't help her. I will send the patient for a KUB and check some blood work to evaluate for other causes. I will then reassess the patient.  Clinical Course as of Feb 27 306  Nancy Fetter Feb 28, 2016  0141 Generous colonic stool volume without evidence of bowel obstruction or perforation.   DG Abdomen 1 View [AW]    Clinical Course User Index [AW] Loney Hering, MD   The patient does have a white blood cell count of 27. She did receive Neulasta after her chemotherapy which is likely what caused the red blood cells to be elevated. The patient's x-ray shows some generous colonic stool volume without evidence of bowel obstruction or perforation. I did give the patient some magnesium citrate and she was unable to have a bowel movement. She did not have stool in her rectal vault. I discussed the results with the patient and informed her that I would like to try some  lactulose. I did give the patient a dose of lactulose but I will send her home with a prescription as well. She did feel comfortable with this medication and will try this medication at home. She reports that her doctor was going to give her a prescription for medication but they're unsure exactly what it was. The patient will be discharged home. She has no further complaints or concerns at this time.  ____________________________________________   FINAL CLINICAL IMPRESSION(S) / ED DIAGNOSES  Final diagnoses:  Other constipation      NEW MEDICATIONS STARTED DURING THIS VISIT:  Discharge Medication List as of 02/28/2016  2:43 AM    START taking these medications   Details  lactulose (CHRONULAC) 10 GM/15ML solution Take 45 mLs (30 g total) by mouth 2 (two) times daily as needed for mild constipation or moderate constipation., Starting Sun 02/28/2016, Print         Note:  This document was prepared using Dragon voice recognition software and may include unintentional dictation errors.    Loney Hering, MD 02/28/16 [id number]

## 2016-02-28 NOTE — ED Notes (Signed)
Pt is in good condition; discharge instructions reviewed, follow up care and home care reviewed; prescription medication reviewed; pt verbalized understanding; pt is ambulatory and went home with husband

## 2016-03-08 ENCOUNTER — Encounter: Payer: Self-pay | Admitting: *Deleted

## 2016-03-08 ENCOUNTER — Telehealth: Payer: Self-pay | Admitting: *Deleted

## 2016-03-08 NOTE — Telephone Encounter (Signed)
Patient called in to request letter be faxed to [place] for absence on 02/26/16 due to chemotherapy side effects. Letter ready and will be faxed to [place] today.

## 2016-03-16 NOTE — Progress Notes (Signed)
[place] [facility]  Telephone:([phone]) [phone] Fax:(336) [phone]  ID: Manfred Arch OB: 12-27-1965  MR#: [id number]  [id number]  Patient Care Team: Marinda Elk, MD as PCP - General (Physician Assistant)  CHIEF COMPLAINT: Pathologic stage Ia poorly differentiated triple negative carcinoma of the lower outer quadrant of the right breast.  INTERVAL HISTORY: Patient returns to clinic today for further evaluation and consideration of cycle 4 of 4 of Taxotere and Cytoxan. She currently feels well and is asymptomatic. She has no neurologic complaints. She denies any recent fevers or illnesses. She has a good appetite and denies weight loss. She has no chest pain or shortness of breath. She denies any nausea, vomiting, or diarrhea. She has no urinary complaints. Patient offers no specific complaints today.  REVIEW OF SYSTEMS:   Review of Systems  Constitutional: Negative.  Negative for fever, malaise/fatigue and weight loss.  Respiratory: Negative.  Negative for cough, hemoptysis and shortness of breath.   Cardiovascular: Negative.  Negative for chest pain and leg swelling.  Gastrointestinal: Positive for constipation. Negative for abdominal pain, blood in stool and melena.  Genitourinary: Negative.   Musculoskeletal: Negative.   Neurological: Negative for sensory change and weakness.  Psychiatric/Behavioral: The patient is not nervous/anxious.     As per HPI. Otherwise, a complete review of systems is negative.  PAST MEDICAL HISTORY: Past Medical History:  Diagnosis Date  . Arthritis    in neck  . Breast cancer, right breast ([place]) 11/18/2015  . GERD (gastroesophageal reflux disease)   . Kidney stones   . Last menstrual period (LMP) > 10 days ago 1995    PAST SURGICAL HISTORY: Past Surgical History:  Procedure Laterality Date  . ABDOMINAL HYSTERECTOMY  1995   partial  . BREAST BIOPSY Right 2017  . BREAST EXCISIONAL BIOPSY Right 12/08/2015   lumpectomy  .  COLONOSCOPY  2016  . PARTIAL MASTECTOMY WITH NEEDLE LOCALIZATION Right 12/08/2015   Procedure: PARTIAL MASTECTOMY WITH NEEDLE LOCALIZATION;  Surgeon: Leonie Green, MD;  Location: [facility] ORS;  Service: General;  Laterality: Right;  . SENTINEL NODE BIOPSY Right 12/08/2015   Procedure: SENTINEL NODE BIOPSY;  Surgeon: Leonie Green, MD;  Location: [facility] ORS;  Service: General;  Laterality: Right;    FAMILY HISTORY: Family History  Problem Relation Age of Onset  . Breast cancer Neg Hx     ADVANCED DIRECTIVES (Y/N):  N  HEALTH MAINTENANCE: Social History  Substance Use Topics  . Smoking status: Never Smoker  . Smokeless tobacco: Never Used  . Alcohol use No     Colonoscopy:  PAP:  Bone density:  Lipid panel:  No Known Allergies  Current Outpatient Prescriptions  Medication Sig Dispense Refill  . acetaminophen (TYLENOL) 325 MG tablet Take 2 tablets by mouth every 4 (four) hours as needed.    Marland Kitchen aspirin EC 81 MG tablet Take 1 tablet by mouth daily.    . carisoprodol (SOMA) 350 MG tablet Take 1 tablet by mouth 4 (four) times daily as needed.    Marland Kitchen ibuprofen (ADVIL,MOTRIN) 200 MG tablet Take 1 tablet by mouth as needed.    . lactulose (CHRONULAC) 10 GM/15ML solution Take 45 mLs (30 g total) by mouth 2 (two) times daily as needed for mild constipation or moderate constipation. 240 mL 0  . meloxicam (MOBIC) 7.5 MG tablet Take 1 tablet by mouth daily as needed.    . Multiple Vitamin (MULTI-VITAMINS) TABS Take 1 tablet by mouth daily.    Marland Kitchen omeprazole (PRILOSEC) 20  MG capsule Take 1 capsule by mouth daily.    . OnabotulinumtoxinA (BOTOX IJ) Inject 1 Dose as directed every 3 (three) months.    . ondansetron (ZOFRAN) 8 MG tablet Take 1 tablet (8 mg total) by mouth 2 (two) times daily as needed for refractory nausea / vomiting. 30 tablet 1  . prochlorperazine (COMPAZINE) 10 MG tablet Take 1 tablet (10 mg total) by mouth every 6 (six) hours as needed (Nausea or vomiting). 30 tablet 1    No current facility-administered medications for this visit.     OBJECTIVE: Vitals:   03/17/16 0913  BP: 109/74  Pulse: 91  Resp: 18  Temp: 98 F (36.7 C)     Body mass index is 30.12 kg/m.    ECOG FS:0 - Asymptomatic  General: Well-developed, well-nourished, no acute distress. Eyes: Pink conjunctiva, anicteric sclera. Breasts: Well healing surgical scar on right breast.  Lungs: Clear to auscultation bilaterally. Heart: Regular rate and rhythm. No rubs, murmurs, or gallops. Abdomen: Soft, nontender, nondistended. No organomegaly noted, normoactive bowel sounds. Musculoskeletal: No edema, cyanosis, or clubbing. Neuro: Alert, answering all questions appropriately. Cranial nerves grossly intact. Skin: No rashes or petechiae noted. Psych: Normal affect.   LAB RESULTS:  Lab Results  Component Value Date   NA 138 03/17/2016   K 4.0 03/17/2016   CL 106 03/17/2016   CO2 28 03/17/2016   GLUCOSE 96 03/17/2016   BUN 16 03/17/2016   CREATININE 0.62 03/17/2016   CALCIUM 9.1 03/17/2016   PROT 6.9 03/17/2016   ALBUMIN 3.8 03/17/2016   AST 24 03/17/2016   ALT 25 03/17/2016   ALKPHOS 50 03/17/2016   BILITOT 0.4 03/17/2016   GFRNONAA >60 03/17/2016   GFRAA >60 03/17/2016    Lab Results  Component Value Date   WBC 6.0 03/17/2016   NEUTROABS 4.3 03/17/2016   HGB 11.2 (L) 03/17/2016   HCT 33.0 (L) 03/17/2016   MCV 88.8 03/17/2016   PLT 352 03/17/2016     STUDIES: Dg Abdomen 1 View  Result Date: 02/28/2016 CLINICAL DATA:  Constipation and abdominal discomfort. EXAM: ABDOMEN - 1 VIEW COMPARISON:  None. FINDINGS: The bowel gas pattern is normal. No radio-opaque calculi or other significant radiographic abnormality are seen. Generous colonic stool volume. IMPRESSION: Generous colonic stool volume without evidence of bowel obstruction or perforation. Electronically Signed   By: Andreas Newport M.D.   On: 02/28/2016 01:32    ASSESSMENT: Pathologic stage Ia poorly  differentiated triple negative carcinoma of the lower outer quadrant of the right breast.  PLAN:    1. Pathologic stage Ia poorly differentiated triple negative carcinoma of the lower outer quadrant of the right breast:  Final pathology confirmed breast primary. Although patient is a stage I, have recommended adjuvant chemotherapy given her young age, the triple negative status of her disease, and tumor size greater than 5 mm. Previously, CT of the chest, abdomen, and pelvis did not reveal any metastatic disease. Proceed with cycle 4 of 4 of Taxotere plus Cytoxan today. Continue OnPro Neulasta support. Patient is now completed chemotherapy and a referral was made to radiation oncology for adjuvant XRT. She does not require an aromatase inhibitor given the triple negative status of her disease. Return to clinic in 3 months for routine evaluation. 2. Genetic testing: Given patient's age and triple negative status of her tumor, will consider genetic testing in the future. 3. Constipation: Continue OTC treatments as indicated.  Patient expressed understanding and was in agreement with this plan. She also  understands that She can call clinic at any time with any questions, concerns, or complaints.   Breast cancer of lower-outer quadrant of right female breast [facility])   Staging form: Breast, AJCC 7th Edition   - Clinical stage from 12/20/2015: Stage IA (T1b, N0, M0) - Signed by Lloyd Huger, MD on 12/20/2015   Lloyd Huger, MD 03/19/16 8:18 AM

## 2016-03-17 ENCOUNTER — Inpatient Hospital Stay (HOSPITAL_BASED_OUTPATIENT_CLINIC_OR_DEPARTMENT_OTHER): Payer: 59 | Admitting: Oncology

## 2016-03-17 ENCOUNTER — Inpatient Hospital Stay: Payer: 59 | Attending: Oncology

## 2016-03-17 ENCOUNTER — Inpatient Hospital Stay: Payer: 59

## 2016-03-17 VITALS — BP 109/74 | HR 91 | Temp 98.0°F | Resp 18 | Wt 181.0 lb

## 2016-03-17 DIAGNOSIS — K219 Gastro-esophageal reflux disease without esophagitis: Secondary | ICD-10-CM | POA: Insufficient documentation

## 2016-03-17 DIAGNOSIS — Z87442 Personal history of urinary calculi: Secondary | ICD-10-CM | POA: Insufficient documentation

## 2016-03-17 DIAGNOSIS — Z171 Estrogen receptor negative status [ER-]: Principal | ICD-10-CM

## 2016-03-17 DIAGNOSIS — Z7982 Long term (current) use of aspirin: Secondary | ICD-10-CM | POA: Diagnosis not present

## 2016-03-17 DIAGNOSIS — Z79899 Other long term (current) drug therapy: Secondary | ICD-10-CM | POA: Insufficient documentation

## 2016-03-17 DIAGNOSIS — Z7689 Persons encountering health services in other specified circumstances: Secondary | ICD-10-CM

## 2016-03-17 DIAGNOSIS — C50511 Malignant neoplasm of lower-outer quadrant of right female breast: Secondary | ICD-10-CM

## 2016-03-17 DIAGNOSIS — K5900 Constipation, unspecified: Secondary | ICD-10-CM | POA: Insufficient documentation

## 2016-03-17 DIAGNOSIS — Z5111 Encounter for antineoplastic chemotherapy: Secondary | ICD-10-CM | POA: Insufficient documentation

## 2016-03-17 LAB — COMPREHENSIVE METABOLIC PANEL
ALBUMIN: 3.8 g/dL (ref 3.5–5.0)
ALK PHOS: 50 U/L (ref 38–126)
ALT: 25 U/L (ref 14–54)
ANION GAP: 4 — AB (ref 5–15)
AST: 24 U/L (ref 15–41)
BUN: 16 mg/dL (ref 6–20)
CALCIUM: 9.1 mg/dL (ref 8.9–10.3)
CO2: 28 mmol/L (ref 22–32)
Chloride: 106 mmol/L (ref 101–111)
Creatinine, Ser: 0.62 mg/dL (ref 0.44–1.00)
GFR calc Af Amer: >60 mL/min (ref >60)
GFR calc non Af Amer: >60 mL/min (ref >60)
Glucose, Bld: 96 mg/dL (ref 65–99)
Potassium: 4 mmol/L (ref 3.5–5.1)
SODIUM: 138 mmol/L (ref 135–145)
Total Bilirubin: 0.4 mg/dL (ref 0.3–1.2)
Total Protein: 6.9 g/dL (ref 6.5–8.1)

## 2016-03-17 LAB — CBC WITH DIFFERENTIAL/PLATELET
BASOS PCT: 1 %
Basophils Absolute: 0.1 10*3/uL (ref 0–0.1)
EOS ABS: 0.1 10*3/uL (ref 0–0.7)
Eosinophils Relative: 1 %
HCT: 33 % — ABNORMAL LOW (ref 35.0–47.0)
HEMOGLOBIN: 11.2 g/dL — AB (ref 12.0–16.0)
Lymphocytes Relative: 10 %
Lymphs Abs: 0.6 10*3/uL — ABNORMAL LOW (ref 1.0–3.6)
MCH: 30 pg (ref 26.0–34.0)
MCHC: 33.9 g/dL (ref 32.0–36.0)
MCV: 88.8 fL (ref 80.0–100.0)
Monocytes Absolute: 0.9 10*3/uL (ref 0.2–0.9)
Monocytes Relative: 15 %
NEUTROS PCT: 73 %
Neutro Abs: 4.3 10*3/uL (ref 1.4–6.5)
Platelets: 352 10*3/uL (ref 150–440)
RBC: 3.72 MIL/uL — ABNORMAL LOW (ref 3.80–5.20)
RDW: 17.3 % — ABNORMAL HIGH (ref 11.5–14.5)
WBC: 6 10*3/uL (ref 3.6–11.0)

## 2016-03-17 MED ORDER — PEGFILGRASTIM 6 MG/0.6ML ~~LOC~~ PSKT
6.0000 mg | PREFILLED_SYRINGE | Freq: Once | SUBCUTANEOUS | Status: AC
  Administered 2016-03-17: 6 mg via SUBCUTANEOUS
  Filled 2016-03-17: qty 0.6

## 2016-03-17 MED ORDER — PALONOSETRON HCL INJECTION 0.25 MG/5ML
0.2500 mg | Freq: Once | INTRAVENOUS | Status: AC
  Administered 2016-03-17: 0.25 mg via INTRAVENOUS
  Filled 2016-03-17: qty 5

## 2016-03-17 MED ORDER — DOCETAXEL CHEMO INJECTION 160 MG/16ML
75.0000 mg/m2 | Freq: Once | INTRAVENOUS | Status: AC
  Administered 2016-03-17: 140 mg via INTRAVENOUS
  Filled 2016-03-17: qty 14

## 2016-03-17 MED ORDER — DEXAMETHASONE SODIUM PHOSPHATE 10 MG/ML IJ SOLN
10.0000 mg | Freq: Once | INTRAMUSCULAR | Status: AC
Start: 2016-03-17 — End: 2016-03-17
  Administered 2016-03-17: 10 mg via INTRAVENOUS
  Filled 2016-03-17: qty 1

## 2016-03-17 MED ORDER — SODIUM CHLORIDE 0.9 % IV SOLN
Freq: Once | INTRAVENOUS | Status: AC
  Administered 2016-03-17: 10:00:00 via INTRAVENOUS
  Filled 2016-03-17: qty 1000

## 2016-03-17 MED ORDER — SODIUM CHLORIDE 0.9 % IV SOLN
600.0000 mg/m2 | Freq: Once | INTRAVENOUS | Status: AC
  Administered 2016-03-17: 1160 mg via INTRAVENOUS
  Filled 2016-03-17: qty 50

## 2016-03-17 NOTE — Progress Notes (Signed)
Offers no complaints. Feeling well. 

## 2016-03-21 ENCOUNTER — Encounter: Payer: Self-pay | Admitting: *Deleted

## 2016-03-21 ENCOUNTER — Telehealth: Payer: Self-pay | Admitting: *Deleted

## 2016-03-21 NOTE — Telephone Encounter (Signed)
Pt had to leave work early today due to side effects of chemo. Pt requests letter be sent to [place] that patient had to miss work today. Letter has been faxed.

## 2016-03-22 ENCOUNTER — Encounter: Payer: Self-pay | Admitting: *Deleted

## 2016-03-22 ENCOUNTER — Telehealth: Payer: Self-pay | Admitting: *Deleted

## 2016-03-22 NOTE — Telephone Encounter (Signed)
That's fine. Thanks!

## 2016-03-22 NOTE — Telephone Encounter (Signed)
Do you have recommendations regarding her epigastric pain and shortness of breath?

## 2016-03-22 NOTE — Telephone Encounter (Signed)
Is this a new complaint? I do not see where she's had this before other than some constipation.  Maybe try OTC antacids.  Is the SOB constant or intermittent?  She has an appt with Dr. Donella Stade coming up, he could possibly evaluate her then.

## 2016-03-22 NOTE — Telephone Encounter (Signed)
Ok, thank you

## 2016-03-22 NOTE — Telephone Encounter (Signed)
Pt has been taking OTC prilosec once a day. Pt instructed to increased to BID to help with acid reflux. Pt instructed to monitor shortness of breath at this time and to go to urgent care or ED if shortness of breath worsens. Pt was able to talk on the phone without difficulty. Pt verbalized understanding.

## 2016-03-22 NOTE — Telephone Encounter (Signed)
Pt unable to work today due to side effects of treatment. Pt will need letter faxed to [place]. Pt is concerned about side effects of epigastric pain and shortness of breath with exertion. Please advise.

## 2016-03-31 ENCOUNTER — Encounter: Payer: Self-pay | Admitting: Radiation Oncology

## 2016-03-31 ENCOUNTER — Ambulatory Visit
Admission: RE | Admit: 2016-03-31 | Discharge: 2016-03-31 | Disposition: A | Discharge status: Home / Self Care | Payer: 59 | Source: Ambulatory Visit | Attending: Radiation Oncology | Admitting: Radiation Oncology

## 2016-03-31 VITALS — BP 130/81 | HR 104 | Temp 97.4°F | Resp 18 | Wt 180.9 lb

## 2016-03-31 DIAGNOSIS — Z87442 Personal history of urinary calculi: Secondary | ICD-10-CM | POA: Insufficient documentation

## 2016-03-31 DIAGNOSIS — Z9011 Acquired absence of right breast and nipple: Secondary | ICD-10-CM | POA: Insufficient documentation

## 2016-03-31 DIAGNOSIS — G629 Polyneuropathy, unspecified: Secondary | ICD-10-CM | POA: Insufficient documentation

## 2016-03-31 DIAGNOSIS — Z79899 Other long term (current) drug therapy: Secondary | ICD-10-CM | POA: Diagnosis not present

## 2016-03-31 DIAGNOSIS — C50511 Malignant neoplasm of lower-outer quadrant of right female breast: Secondary | ICD-10-CM | POA: Insufficient documentation

## 2016-03-31 DIAGNOSIS — Z510 Encounter for antineoplastic radiation therapy: Secondary | ICD-10-CM | POA: Diagnosis not present

## 2016-03-31 DIAGNOSIS — Z90710 Acquired absence of both cervix and uterus: Secondary | ICD-10-CM | POA: Insufficient documentation

## 2016-03-31 DIAGNOSIS — Z9221 Personal history of antineoplastic chemotherapy: Secondary | ICD-10-CM | POA: Insufficient documentation

## 2016-03-31 DIAGNOSIS — K219 Gastro-esophageal reflux disease without esophagitis: Secondary | ICD-10-CM | POA: Insufficient documentation

## 2016-03-31 DIAGNOSIS — M129 Arthropathy, unspecified: Secondary | ICD-10-CM | POA: Diagnosis not present

## 2016-03-31 DIAGNOSIS — Z171 Estrogen receptor negative status [ER-]: Secondary | ICD-10-CM | POA: Insufficient documentation

## 2016-03-31 DIAGNOSIS — Z7982 Long term (current) use of aspirin: Secondary | ICD-10-CM | POA: Diagnosis not present

## 2016-03-31 NOTE — Consult Note (Signed)
NEW PATIENT EVALUATION  Name: Melanie Campos  MRN: [id number]  Date:   03/31/2016     DOB: 04/19/1965   This 51 y.o. female patient presents to the clinic for initial evaluation of stage Ia poorly differentiated triple negative invasive mammary carcinoma of the lower-outer quadrant of the right breast status post wide local excision and sentinel node biopsy and adjuvant chemotherapy.  REFERRING PHYSICIAN: Marinda Elk, MD  CHIEF COMPLAINT:  Chief Complaint  Patient presents with  . Breast Cancer    Pt is here for initial consulation of breast cancer.      DIAGNOSIS: The encounter diagnosis was Malignant neoplasm of lower-outer quadrant of right breast of female, estrogen receptor negative ([place]).   PREVIOUS INVESTIGATIONS:  Mammograms ultrasound reviewed Pathology reports reviewed Clinical notes reviewed   HPI: Patient is a 51 year old female who initially presented with an abnormal mammogram in the right breast showing a lesion at the 6:00 position 6 m from the nipple measuring 7 x 6 x 6 mm. She underwent ultrasound-guided biopsy which was positive for invasive mammary carcinoma. She went on to have a wide local excision and sentinel node biopsy showing a 6 mm lesion of grade 3 invasive mammary carcinoma ER/PR negative HER-2/neu negative. One sentinel lymph node was negative for metastatic disease. Margins clear at 1 cm. She has completed Cytoxan and Taxotere which she tolerated well with minimal peripheral neuropathy. She seen today for consideration of radiation therapy. She specifically denies breast tenderness cough or bone pain.  PLANNED TREATMENT REGIMEN: Whole breast radiation  PAST MEDICAL HISTORY:  has a past medical history of Arthritis; Breast cancer, right breast ([place]) (11/18/2015); GERD (gastroesophageal reflux disease); Kidney stones; and Last menstrual period (LMP) > 10 days ago (1995).    PAST SURGICAL HISTORY:  Past Surgical History:  Procedure Laterality  Date  . ABDOMINAL HYSTERECTOMY  1995   partial  . BREAST BIOPSY Right 2017  . BREAST EXCISIONAL BIOPSY Right 12/08/2015   lumpectomy  . COLONOSCOPY  2016  . PARTIAL MASTECTOMY WITH NEEDLE LOCALIZATION Right 12/08/2015   Procedure: PARTIAL MASTECTOMY WITH NEEDLE LOCALIZATION;  Surgeon: Leonie Green, MD;  Location: [facility] ORS;  Service: General;  Laterality: Right;  . SENTINEL NODE BIOPSY Right 12/08/2015   Procedure: SENTINEL NODE BIOPSY;  Surgeon: Leonie Green, MD;  Location: [facility] ORS;  Service: General;  Laterality: Right;    FAMILY HISTORY: family history is not on file.  SOCIAL HISTORY:  reports that she has never smoked. She has never used smokeless tobacco. She reports that she does not drink alcohol or use drugs.  ALLERGIES: Patient has no known allergies.  MEDICATIONS:  Current Outpatient Prescriptions  Medication Sig Dispense Refill  . acetaminophen (TYLENOL) 325 MG tablet Take 2 tablets by mouth every 4 (four) hours as needed.    Marland Kitchen aspirin EC 81 MG tablet Take 1 tablet by mouth daily.    . carisoprodol (SOMA) 350 MG tablet Take 1 tablet by mouth 4 (four) times daily as needed.    Marland Kitchen ibuprofen (ADVIL,MOTRIN) 200 MG tablet Take 1 tablet by mouth as needed.    . lactulose (CHRONULAC) 10 GM/15ML solution Take 45 mLs (30 g total) by mouth 2 (two) times daily as needed for mild constipation or moderate constipation. 240 mL 0  . meloxicam (MOBIC) 7.5 MG tablet Take 1 tablet by mouth daily as needed.    . Multiple Vitamin (MULTI-VITAMINS) TABS Take 1 tablet by mouth daily.    Marland Kitchen omeprazole (PRILOSEC) 20  MG capsule Take 1 capsule by mouth daily.    . OnabotulinumtoxinA (BOTOX IJ) Inject 1 Dose as directed every 3 (three) months.    . ondansetron (ZOFRAN) 8 MG tablet Take 1 tablet (8 mg total) by mouth 2 (two) times daily as needed for refractory nausea / vomiting. 30 tablet 1  . prochlorperazine (COMPAZINE) 10 MG tablet Take 1 tablet (10 mg total) by mouth every 6 (six) hours  as needed (Nausea or vomiting). 30 tablet 1   No current facility-administered medications for this encounter.     ECOG PERFORMANCE STATUS:  0 - Asymptomatic  REVIEW OF SYSTEMS:  Patient denies any weight loss, fatigue, weakness, fever, chills or night sweats. Patient denies any loss of vision, blurred vision. Patient denies any ringing  of the ears or hearing loss. No irregular heartbeat. Patient denies heart murmur or history of fainting. Patient denies any chest pain or pain radiating to her upper extremities. Patient denies any shortness of breath, difficulty breathing at night, cough or hemoptysis. Patient denies any swelling in the lower legs. Patient denies any nausea vomiting, vomiting of blood, or coffee ground material in the vomitus. Patient denies any stomach pain. Patient states has had normal bowel movements no significant constipation or diarrhea. Patient denies any dysuria, hematuria or significant nocturia. Patient denies any problems walking, swelling in the joints or loss of balance. Patient denies any skin changes, loss of hair or loss of weight. Patient denies any excessive worrying or anxiety or significant depression. Patient denies any problems with insomnia. Patient denies excessive thirst, polyuria, polydipsia. Patient denies any swollen glands, patient denies easy bruising or easy bleeding. Patient denies any recent infections, allergies or URI. Patient "s visual fields have not changed significantly in recent time.    PHYSICAL EXAM: BP 130/81   Pulse (!) 104   Temp 97.4 F (36.3 C)   Resp 18   Wt 180 lb 14.2 oz (82.1 kg)   BMI 30.10 kg/m  She has a wide local excision scar in the inferior 6:00 position of the right breast. No dominant mass or nodularity is noted in either breast in 2 positions examined. No axillary or supraclavicular adenopathy is identified. Well-developed well-nourished patient in NAD. HEENT reveals PERLA, EOMI, discs not visualized.  Oral cavity is  clear. No oral mucosal lesions are identified. Neck is clear without evidence of cervical or supraclavicular adenopathy. Lungs are clear to A&P. Cardiac examination is essentially unremarkable with regular rate and rhythm without murmur rub or thrill. Abdomen is benign with no organomegaly or masses noted. Motor sensory and DTR levels are equal and symmetric in the upper and lower extremities. Cranial nerves II through XII are grossly intact. Proprioception is intact. No peripheral adenopathy or edema is identified. No motor or sensory levels are noted. Crude visual fields are within normal range.  LABORATORY DATA: Pathology reports reviewed    RADIOLOGY RESULTS: Mammograms ultrasound reviewed   IMPRESSION: Stage IA triple negative invasive mammary carcinoma the right breast status post wide local excision and adjuvant chemotherapy in 51 year old female  PLAN: At this time I have recommended whole breast radiation. Patient's breast is rather large making hypofractionated course of treatment difficult. Would plan on the delivering 5040 cGy in 28 fractions. I would also boost her scar another 1400 cGy using electron beam. Risks and benefits of treatment including skin reaction fatigue alteration of blood counts possible inclusion of superficial lung all were discussed in detail with the patient.There will be extra effort by both  professional staff as well as Corporate investment banker to coordinate and manage concurrent chemoradiation and ensuing side effects during her treatments. I have personally set up and ordered CT simulation for next week. Patient will not be candidate for antiestrogen therapy based on the triple negative nature of her disease.  I would like to take this opportunity to thank you for allowing me to participate in the care of your patient.Armstead Peaks., MD

## 2016-04-05 ENCOUNTER — Ambulatory Visit
Admission: RE | Admit: 2016-04-05 | Discharge: 2016-04-05 | Disposition: A | Discharge status: Home / Self Care | Payer: 59 | Source: Ambulatory Visit | Attending: Radiation Oncology | Admitting: Radiation Oncology

## 2016-04-05 DIAGNOSIS — C50511 Malignant neoplasm of lower-outer quadrant of right female breast: Secondary | ICD-10-CM | POA: Diagnosis not present

## 2016-04-05 DIAGNOSIS — Z171 Estrogen receptor negative status [ER-]: Secondary | ICD-10-CM | POA: Diagnosis not present

## 2016-04-06 DIAGNOSIS — G245 Blepharospasm: Secondary | ICD-10-CM | POA: Diagnosis not present

## 2016-04-08 ENCOUNTER — Other Ambulatory Visit: Payer: Self-pay | Admitting: *Deleted

## 2016-04-08 DIAGNOSIS — C50511 Malignant neoplasm of lower-outer quadrant of right female breast: Secondary | ICD-10-CM

## 2016-04-08 DIAGNOSIS — Z171 Estrogen receptor negative status [ER-]: Principal | ICD-10-CM

## 2016-04-11 DIAGNOSIS — C50511 Malignant neoplasm of lower-outer quadrant of right female breast: Secondary | ICD-10-CM | POA: Diagnosis not present

## 2016-04-11 DIAGNOSIS — Z171 Estrogen receptor negative status [ER-]: Secondary | ICD-10-CM | POA: Diagnosis not present

## 2016-04-12 ENCOUNTER — Ambulatory Visit
Admission: RE | Admit: 2016-04-12 | Discharge: 2016-04-12 | Disposition: A | Discharge status: Home / Self Care | Payer: 59 | Source: Ambulatory Visit | Attending: Radiation Oncology | Admitting: Radiation Oncology

## 2016-04-12 DIAGNOSIS — Z171 Estrogen receptor negative status [ER-]: Secondary | ICD-10-CM | POA: Diagnosis not present

## 2016-04-12 DIAGNOSIS — C50511 Malignant neoplasm of lower-outer quadrant of right female breast: Secondary | ICD-10-CM | POA: Diagnosis not present

## 2016-04-13 ENCOUNTER — Ambulatory Visit
Admission: RE | Admit: 2016-04-13 | Discharge: 2016-04-13 | Disposition: A | Discharge status: Home / Self Care | Payer: 59 | Source: Ambulatory Visit | Attending: Radiation Oncology | Admitting: Radiation Oncology

## 2016-04-13 DIAGNOSIS — C50511 Malignant neoplasm of lower-outer quadrant of right female breast: Secondary | ICD-10-CM | POA: Diagnosis not present

## 2016-04-14 ENCOUNTER — Ambulatory Visit
Admission: RE | Admit: 2016-04-14 | Discharge: 2016-04-14 | Disposition: A | Discharge status: Home / Self Care | Payer: 59 | Source: Ambulatory Visit | Attending: Radiation Oncology | Admitting: Radiation Oncology

## 2016-04-14 DIAGNOSIS — C50511 Malignant neoplasm of lower-outer quadrant of right female breast: Secondary | ICD-10-CM | POA: Diagnosis not present

## 2016-04-15 ENCOUNTER — Ambulatory Visit
Admission: RE | Admit: 2016-04-15 | Discharge: 2016-04-15 | Disposition: A | Discharge status: Home / Self Care | Payer: 59 | Source: Ambulatory Visit | Attending: Radiation Oncology | Admitting: Radiation Oncology

## 2016-04-15 DIAGNOSIS — C50511 Malignant neoplasm of lower-outer quadrant of right female breast: Secondary | ICD-10-CM | POA: Diagnosis not present

## 2016-04-18 ENCOUNTER — Ambulatory Visit
Admission: RE | Admit: 2016-04-18 | Discharge: 2016-04-18 | Disposition: A | Discharge status: Home / Self Care | Payer: 59 | Source: Ambulatory Visit | Attending: Radiation Oncology | Admitting: Radiation Oncology

## 2016-04-18 DIAGNOSIS — C50511 Malignant neoplasm of lower-outer quadrant of right female breast: Secondary | ICD-10-CM | POA: Diagnosis not present

## 2016-04-19 ENCOUNTER — Ambulatory Visit
Admission: RE | Admit: 2016-04-19 | Discharge: 2016-04-19 | Disposition: A | Discharge status: Home / Self Care | Payer: 59 | Source: Ambulatory Visit | Attending: Radiation Oncology | Admitting: Radiation Oncology

## 2016-04-19 DIAGNOSIS — C50511 Malignant neoplasm of lower-outer quadrant of right female breast: Secondary | ICD-10-CM | POA: Diagnosis not present

## 2016-04-19 DIAGNOSIS — Z171 Estrogen receptor negative status [ER-]: Secondary | ICD-10-CM | POA: Diagnosis not present

## 2016-04-20 ENCOUNTER — Ambulatory Visit
Admission: RE | Admit: 2016-04-20 | Discharge: 2016-04-20 | Disposition: A | Discharge status: Home / Self Care | Payer: 59 | Source: Ambulatory Visit | Attending: Radiation Oncology | Admitting: Radiation Oncology

## 2016-04-20 DIAGNOSIS — C50511 Malignant neoplasm of lower-outer quadrant of right female breast: Secondary | ICD-10-CM | POA: Diagnosis not present

## 2016-04-21 ENCOUNTER — Ambulatory Visit
Admission: RE | Admit: 2016-04-21 | Discharge: 2016-04-21 | Disposition: A | Discharge status: Home / Self Care | Payer: 59 | Source: Ambulatory Visit | Attending: Radiation Oncology | Admitting: Radiation Oncology

## 2016-04-21 DIAGNOSIS — C50511 Malignant neoplasm of lower-outer quadrant of right female breast: Secondary | ICD-10-CM | POA: Diagnosis not present

## 2016-04-22 ENCOUNTER — Ambulatory Visit
Admission: RE | Admit: 2016-04-22 | Discharge: 2016-04-22 | Disposition: A | Discharge status: Home / Self Care | Payer: 59 | Source: Ambulatory Visit | Attending: Radiation Oncology | Admitting: Radiation Oncology

## 2016-04-22 DIAGNOSIS — C50511 Malignant neoplasm of lower-outer quadrant of right female breast: Secondary | ICD-10-CM | POA: Diagnosis not present

## 2016-04-25 ENCOUNTER — Ambulatory Visit
Admission: RE | Admit: 2016-04-25 | Discharge: 2016-04-25 | Disposition: A | Discharge status: Home / Self Care | Payer: 59 | Source: Ambulatory Visit | Attending: Radiation Oncology | Admitting: Radiation Oncology

## 2016-04-25 DIAGNOSIS — C50511 Malignant neoplasm of lower-outer quadrant of right female breast: Secondary | ICD-10-CM | POA: Diagnosis not present

## 2016-04-26 ENCOUNTER — Ambulatory Visit
Admission: RE | Admit: 2016-04-26 | Discharge: 2016-04-26 | Disposition: A | Discharge status: Home / Self Care | Payer: 59 | Source: Ambulatory Visit | Attending: Radiation Oncology | Admitting: Radiation Oncology

## 2016-04-26 DIAGNOSIS — Z171 Estrogen receptor negative status [ER-]: Secondary | ICD-10-CM | POA: Diagnosis not present

## 2016-04-26 DIAGNOSIS — C50511 Malignant neoplasm of lower-outer quadrant of right female breast: Secondary | ICD-10-CM | POA: Diagnosis not present

## 2016-04-27 ENCOUNTER — Ambulatory Visit
Admission: RE | Admit: 2016-04-27 | Discharge: 2016-04-27 | Disposition: A | Discharge status: Home / Self Care | Payer: 59 | Source: Ambulatory Visit | Attending: Radiation Oncology | Admitting: Radiation Oncology

## 2016-04-27 ENCOUNTER — Inpatient Hospital Stay: Payer: 59 | Attending: Oncology

## 2016-04-27 DIAGNOSIS — Z79899 Other long term (current) drug therapy: Secondary | ICD-10-CM | POA: Diagnosis not present

## 2016-04-27 DIAGNOSIS — Z87442 Personal history of urinary calculi: Secondary | ICD-10-CM | POA: Insufficient documentation

## 2016-04-27 DIAGNOSIS — Z7689 Persons encountering health services in other specified circumstances: Secondary | ICD-10-CM | POA: Insufficient documentation

## 2016-04-27 DIAGNOSIS — K219 Gastro-esophageal reflux disease without esophagitis: Secondary | ICD-10-CM | POA: Insufficient documentation

## 2016-04-27 DIAGNOSIS — Z7982 Long term (current) use of aspirin: Secondary | ICD-10-CM | POA: Diagnosis not present

## 2016-04-27 DIAGNOSIS — K5900 Constipation, unspecified: Secondary | ICD-10-CM | POA: Insufficient documentation

## 2016-04-27 DIAGNOSIS — C50511 Malignant neoplasm of lower-outer quadrant of right female breast: Secondary | ICD-10-CM | POA: Diagnosis present

## 2016-04-27 DIAGNOSIS — Z171 Estrogen receptor negative status [ER-]: Secondary | ICD-10-CM | POA: Diagnosis not present

## 2016-04-27 LAB — CBC
HCT: 35 % (ref 35.0–47.0)
HEMOGLOBIN: 12.1 g/dL (ref 12.0–16.0)
MCH: 31 pg (ref 26.0–34.0)
MCHC: 34.7 g/dL (ref 32.0–36.0)
MCV: 89.5 fL (ref 80.0–100.0)
PLATELETS: 271 10*3/uL (ref 150–440)
RBC: 3.92 MIL/uL (ref 3.80–5.20)
RDW: 15.7 % — ABNORMAL HIGH (ref 11.5–14.5)
WBC: 5.8 10*3/uL (ref 3.6–11.0)

## 2016-04-28 ENCOUNTER — Ambulatory Visit
Admission: RE | Admit: 2016-04-28 | Discharge: 2016-04-28 | Disposition: A | Discharge status: Home / Self Care | Payer: 59 | Source: Ambulatory Visit | Attending: Radiation Oncology | Admitting: Radiation Oncology

## 2016-04-28 DIAGNOSIS — C50511 Malignant neoplasm of lower-outer quadrant of right female breast: Secondary | ICD-10-CM | POA: Diagnosis not present

## 2016-04-29 ENCOUNTER — Ambulatory Visit
Admission: RE | Admit: 2016-04-29 | Discharge: 2016-04-29 | Disposition: A | Discharge status: Home / Self Care | Payer: 59 | Source: Ambulatory Visit | Attending: Radiation Oncology | Admitting: Radiation Oncology

## 2016-04-29 DIAGNOSIS — C50511 Malignant neoplasm of lower-outer quadrant of right female breast: Secondary | ICD-10-CM | POA: Diagnosis not present

## 2016-05-02 ENCOUNTER — Ambulatory Visit
Admission: RE | Admit: 2016-05-02 | Discharge: 2016-05-02 | Disposition: A | Discharge status: Home / Self Care | Payer: 59 | Source: Ambulatory Visit | Attending: Radiation Oncology | Admitting: Radiation Oncology

## 2016-05-02 DIAGNOSIS — C50511 Malignant neoplasm of lower-outer quadrant of right female breast: Secondary | ICD-10-CM | POA: Diagnosis not present

## 2016-05-03 ENCOUNTER — Ambulatory Visit
Admission: RE | Admit: 2016-05-03 | Discharge: 2016-05-03 | Disposition: A | Discharge status: Home / Self Care | Payer: 59 | Source: Ambulatory Visit | Attending: Radiation Oncology | Admitting: Radiation Oncology

## 2016-05-03 DIAGNOSIS — Z171 Estrogen receptor negative status [ER-]: Secondary | ICD-10-CM | POA: Diagnosis not present

## 2016-05-03 DIAGNOSIS — C50511 Malignant neoplasm of lower-outer quadrant of right female breast: Secondary | ICD-10-CM | POA: Diagnosis not present

## 2016-05-04 ENCOUNTER — Ambulatory Visit
Admission: RE | Admit: 2016-05-04 | Discharge: 2016-05-04 | Disposition: A | Discharge status: Home / Self Care | Payer: 59 | Source: Ambulatory Visit | Attending: Radiation Oncology | Admitting: Radiation Oncology

## 2016-05-04 DIAGNOSIS — C50511 Malignant neoplasm of lower-outer quadrant of right female breast: Secondary | ICD-10-CM | POA: Diagnosis not present

## 2016-05-05 ENCOUNTER — Ambulatory Visit
Admission: RE | Admit: 2016-05-05 | Discharge: 2016-05-05 | Disposition: A | Discharge status: Home / Self Care | Payer: 59 | Source: Ambulatory Visit | Attending: Radiation Oncology | Admitting: Radiation Oncology

## 2016-05-05 DIAGNOSIS — C50511 Malignant neoplasm of lower-outer quadrant of right female breast: Secondary | ICD-10-CM | POA: Diagnosis not present

## 2016-05-06 ENCOUNTER — Ambulatory Visit
Admission: RE | Admit: 2016-05-06 | Discharge: 2016-05-06 | Disposition: A | Discharge status: Home / Self Care | Payer: 59 | Source: Ambulatory Visit | Attending: Radiation Oncology | Admitting: Radiation Oncology

## 2016-05-06 DIAGNOSIS — C50511 Malignant neoplasm of lower-outer quadrant of right female breast: Secondary | ICD-10-CM | POA: Diagnosis not present

## 2016-05-09 ENCOUNTER — Ambulatory Visit
Admission: RE | Admit: 2016-05-09 | Discharge: 2016-05-09 | Disposition: A | Discharge status: Home / Self Care | Payer: 59 | Source: Ambulatory Visit | Attending: Radiation Oncology | Admitting: Radiation Oncology

## 2016-05-09 DIAGNOSIS — C50511 Malignant neoplasm of lower-outer quadrant of right female breast: Secondary | ICD-10-CM | POA: Diagnosis not present

## 2016-05-10 ENCOUNTER — Ambulatory Visit
Admission: RE | Admit: 2016-05-10 | Discharge: 2016-05-10 | Disposition: A | Discharge status: Home / Self Care | Payer: 59 | Source: Ambulatory Visit | Attending: Radiation Oncology | Admitting: Radiation Oncology

## 2016-05-10 DIAGNOSIS — Z171 Estrogen receptor negative status [ER-]: Secondary | ICD-10-CM | POA: Diagnosis not present

## 2016-05-10 DIAGNOSIS — C50511 Malignant neoplasm of lower-outer quadrant of right female breast: Secondary | ICD-10-CM | POA: Diagnosis not present

## 2016-05-11 ENCOUNTER — Ambulatory Visit
Admission: RE | Admit: 2016-05-11 | Discharge: 2016-05-11 | Disposition: A | Discharge status: Home / Self Care | Payer: 59 | Source: Ambulatory Visit | Attending: Radiation Oncology | Admitting: Radiation Oncology

## 2016-05-11 ENCOUNTER — Inpatient Hospital Stay: Payer: 59 | Attending: Oncology

## 2016-05-11 DIAGNOSIS — Z171 Estrogen receptor negative status [ER-]: Secondary | ICD-10-CM | POA: Insufficient documentation

## 2016-05-11 DIAGNOSIS — C50511 Malignant neoplasm of lower-outer quadrant of right female breast: Secondary | ICD-10-CM | POA: Diagnosis not present

## 2016-05-11 LAB — CBC
HEMATOCRIT: 38.7 % (ref 35.0–47.0)
HEMOGLOBIN: 13.1 g/dL (ref 12.0–16.0)
MCH: 30.6 pg (ref 26.0–34.0)
MCHC: 33.8 g/dL (ref 32.0–36.0)
MCV: 90.4 fL (ref 80.0–100.0)
Platelets: 278 10*3/uL (ref 150–440)
RBC: 4.28 MIL/uL (ref 3.80–5.20)
RDW: 14.7 % — AB (ref 11.5–14.5)
WBC: 5.7 10*3/uL (ref 3.6–11.0)

## 2016-05-12 ENCOUNTER — Ambulatory Visit
Admission: RE | Admit: 2016-05-12 | Discharge: 2016-05-12 | Disposition: A | Discharge status: Home / Self Care | Payer: 59 | Source: Ambulatory Visit | Attending: Radiation Oncology | Admitting: Radiation Oncology

## 2016-05-12 DIAGNOSIS — C50511 Malignant neoplasm of lower-outer quadrant of right female breast: Secondary | ICD-10-CM | POA: Diagnosis not present

## 2016-05-13 ENCOUNTER — Ambulatory Visit
Admission: RE | Admit: 2016-05-13 | Discharge: 2016-05-13 | Disposition: A | Discharge status: Home / Self Care | Payer: 59 | Source: Ambulatory Visit | Attending: Radiation Oncology | Admitting: Radiation Oncology

## 2016-05-13 DIAGNOSIS — C50511 Malignant neoplasm of lower-outer quadrant of right female breast: Secondary | ICD-10-CM | POA: Diagnosis not present

## 2016-05-16 ENCOUNTER — Ambulatory Visit
Admission: RE | Admit: 2016-05-16 | Discharge: 2016-05-16 | Disposition: A | Discharge status: Home / Self Care | Payer: 59 | Source: Ambulatory Visit | Attending: Radiation Oncology | Admitting: Radiation Oncology

## 2016-05-16 DIAGNOSIS — C50511 Malignant neoplasm of lower-outer quadrant of right female breast: Secondary | ICD-10-CM | POA: Diagnosis not present

## 2016-05-17 ENCOUNTER — Ambulatory Visit
Admission: RE | Admit: 2016-05-17 | Discharge: 2016-05-17 | Disposition: A | Discharge status: Home / Self Care | Payer: 59 | Source: Ambulatory Visit | Attending: Radiation Oncology | Admitting: Radiation Oncology

## 2016-05-17 DIAGNOSIS — Z171 Estrogen receptor negative status [ER-]: Secondary | ICD-10-CM | POA: Diagnosis not present

## 2016-05-17 DIAGNOSIS — C50511 Malignant neoplasm of lower-outer quadrant of right female breast: Secondary | ICD-10-CM | POA: Diagnosis not present

## 2016-05-18 ENCOUNTER — Ambulatory Visit
Admission: RE | Admit: 2016-05-18 | Discharge: 2016-05-18 | Disposition: A | Discharge status: Home / Self Care | Payer: 59 | Source: Ambulatory Visit | Attending: Radiation Oncology | Admitting: Radiation Oncology

## 2016-05-18 DIAGNOSIS — C50511 Malignant neoplasm of lower-outer quadrant of right female breast: Secondary | ICD-10-CM | POA: Diagnosis not present

## 2016-05-19 ENCOUNTER — Ambulatory Visit
Admission: RE | Admit: 2016-05-19 | Discharge: 2016-05-19 | Disposition: A | Discharge status: Home / Self Care | Payer: 59 | Source: Ambulatory Visit | Attending: Radiation Oncology | Admitting: Radiation Oncology

## 2016-05-19 DIAGNOSIS — C50511 Malignant neoplasm of lower-outer quadrant of right female breast: Secondary | ICD-10-CM | POA: Diagnosis not present

## 2016-05-20 ENCOUNTER — Ambulatory Visit
Admission: RE | Admit: 2016-05-20 | Discharge: 2016-05-20 | Disposition: A | Discharge status: Home / Self Care | Payer: 59 | Source: Ambulatory Visit | Attending: Radiation Oncology | Admitting: Radiation Oncology

## 2016-05-20 DIAGNOSIS — C50511 Malignant neoplasm of lower-outer quadrant of right female breast: Secondary | ICD-10-CM | POA: Diagnosis not present

## 2016-05-23 ENCOUNTER — Ambulatory Visit
Admission: RE | Admit: 2016-05-23 | Discharge: 2016-05-23 | Disposition: A | Discharge status: Home / Self Care | Payer: 59 | Source: Ambulatory Visit | Attending: Radiation Oncology | Admitting: Radiation Oncology

## 2016-05-23 DIAGNOSIS — C50511 Malignant neoplasm of lower-outer quadrant of right female breast: Secondary | ICD-10-CM | POA: Diagnosis not present

## 2016-05-24 ENCOUNTER — Inpatient Hospital Stay: Payer: 59

## 2016-05-24 ENCOUNTER — Ambulatory Visit
Admission: RE | Admit: 2016-05-24 | Discharge: 2016-05-24 | Disposition: A | Discharge status: Home / Self Care | Payer: 59 | Source: Ambulatory Visit | Attending: Radiation Oncology | Admitting: Radiation Oncology

## 2016-05-24 DIAGNOSIS — Z171 Estrogen receptor negative status [ER-]: Secondary | ICD-10-CM | POA: Diagnosis not present

## 2016-05-24 DIAGNOSIS — C50511 Malignant neoplasm of lower-outer quadrant of right female breast: Secondary | ICD-10-CM | POA: Diagnosis not present

## 2016-05-25 ENCOUNTER — Ambulatory Visit
Admission: RE | Admit: 2016-05-25 | Discharge: 2016-05-25 | Disposition: A | Discharge status: Home / Self Care | Payer: 59 | Source: Ambulatory Visit | Attending: Radiation Oncology | Admitting: Radiation Oncology

## 2016-05-25 ENCOUNTER — Inpatient Hospital Stay: Payer: 59

## 2016-05-25 DIAGNOSIS — C50511 Malignant neoplasm of lower-outer quadrant of right female breast: Secondary | ICD-10-CM | POA: Diagnosis not present

## 2016-05-26 ENCOUNTER — Ambulatory Visit
Admission: RE | Admit: 2016-05-26 | Discharge: 2016-05-26 | Disposition: A | Discharge status: Home / Self Care | Payer: 59 | Source: Ambulatory Visit | Attending: Radiation Oncology | Admitting: Radiation Oncology

## 2016-05-26 DIAGNOSIS — C50511 Malignant neoplasm of lower-outer quadrant of right female breast: Secondary | ICD-10-CM | POA: Diagnosis not present

## 2016-05-27 ENCOUNTER — Ambulatory Visit
Admission: RE | Admit: 2016-05-27 | Discharge: 2016-05-27 | Disposition: A | Discharge status: Home / Self Care | Payer: 59 | Source: Ambulatory Visit | Attending: Radiation Oncology | Admitting: Radiation Oncology

## 2016-05-27 DIAGNOSIS — C50511 Malignant neoplasm of lower-outer quadrant of right female breast: Secondary | ICD-10-CM | POA: Diagnosis not present

## 2016-05-30 ENCOUNTER — Ambulatory Visit
Admission: RE | Admit: 2016-05-30 | Discharge: 2016-05-30 | Disposition: A | Discharge status: Home / Self Care | Payer: 59 | Source: Ambulatory Visit | Attending: Radiation Oncology | Admitting: Radiation Oncology

## 2016-05-30 DIAGNOSIS — C50511 Malignant neoplasm of lower-outer quadrant of right female breast: Secondary | ICD-10-CM | POA: Diagnosis not present

## 2016-05-31 ENCOUNTER — Ambulatory Visit
Admission: RE | Admit: 2016-05-31 | Discharge: 2016-05-31 | Disposition: A | Discharge status: Home / Self Care | Payer: 59 | Source: Ambulatory Visit | Attending: Radiation Oncology | Admitting: Radiation Oncology

## 2016-05-31 DIAGNOSIS — C50511 Malignant neoplasm of lower-outer quadrant of right female breast: Secondary | ICD-10-CM | POA: Diagnosis not present

## 2016-05-31 DIAGNOSIS — Z171 Estrogen receptor negative status [ER-]: Secondary | ICD-10-CM | POA: Diagnosis not present

## 2016-06-01 ENCOUNTER — Ambulatory Visit
Admission: RE | Admit: 2016-06-01 | Discharge: 2016-06-01 | Disposition: A | Discharge status: Home / Self Care | Payer: 59 | Source: Ambulatory Visit | Attending: Radiation Oncology | Admitting: Radiation Oncology

## 2016-06-01 DIAGNOSIS — C50511 Malignant neoplasm of lower-outer quadrant of right female breast: Secondary | ICD-10-CM | POA: Diagnosis not present

## 2016-06-15 ENCOUNTER — Ambulatory Visit: Payer: 59 | Admitting: Oncology

## 2016-06-20 ENCOUNTER — Ambulatory Visit: Payer: 59 | Admitting: Oncology

## 2016-06-21 NOTE — Progress Notes (Signed)
[facility]  [phone] [phone]  ID: Manfred Arch OB: 1966-01-31  MR#: [id number]  PNT#:[id number]  Patient Care Team: Marinda Elk, MD as PCP - General (Physician Assistant)  CHIEF COMPLAINT: Pathologic stage Ia poorly differentiated triple negative carcinoma of the lower outer quadrant of the right breast.  INTERVAL HISTORY: Patient returns to clinic today for routine 3 month follow-up. She currently feels well and is asymptomatic. She has no neurologic complaints. She denies any recent fevers or illnesses. She has a good appetite and denies weight loss. She has no chest pain or shortness of breath. She denies any nausea, vomiting, or diarrhea. She has no urinary complaints. Patient offers no specific complaints today.  REVIEW OF SYSTEMS:   Review of Systems  Constitutional: Negative.  Negative for fever, malaise/fatigue and weight loss.  Respiratory: Negative.  Negative for cough, hemoptysis and shortness of breath.   Cardiovascular: Negative.  Negative for chest pain and leg swelling.  Gastrointestinal: Negative for abdominal pain, blood in stool, constipation and melena.  Genitourinary: Negative.   Musculoskeletal: Negative.   Skin: Negative.  Negative for rash.  Neurological: Negative.  Negative for sensory change and weakness.  Psychiatric/Behavioral: Negative.  The patient is not nervous/anxious.     As per HPI. Otherwise, a complete review of systems is negative.  PAST MEDICAL HISTORY: Past Medical History:  Diagnosis Date  . Arthritis    in neck  . Breast cancer, right breast ([place]) 11/18/2015  . GERD (gastroesophageal reflux disease)   . Kidney stones   . Last menstrual period (LMP) > 10 days ago 1995    PAST SURGICAL HISTORY: Past Surgical History:  Procedure Laterality Date  . ABDOMINAL HYSTERECTOMY  1995   partial  . BREAST BIOPSY Right 2017  . BREAST EXCISIONAL BIOPSY Right 12/08/2015   lumpectomy  .  COLONOSCOPY  2016  . PARTIAL MASTECTOMY WITH NEEDLE LOCALIZATION Right 12/08/2015   Procedure: PARTIAL MASTECTOMY WITH NEEDLE LOCALIZATION;  Surgeon: Leonie Green, MD;  Location: [facility] ORS;  Service: General;  Laterality: Right;  . SENTINEL NODE BIOPSY Right 12/08/2015   Procedure: SENTINEL NODE BIOPSY;  Surgeon: Leonie Green, MD;  Location: [facility] ORS;  Service: General;  Laterality: Right;    FAMILY HISTORY: Family History  Problem Relation Age of Onset  . Breast cancer Neg Hx     ADVANCED DIRECTIVES (Y/N):  N  HEALTH MAINTENANCE: Social History  Substance Use Topics  . Smoking status: Never Smoker  . Smokeless tobacco: Never Used  . Alcohol use No     Colonoscopy:  PAP:  Bone density:  Lipid panel:  No Known Allergies  Current Outpatient Prescriptions  Medication Sig Dispense Refill  . acetaminophen (TYLENOL) 325 MG tablet Take 2 tablets by mouth every 4 (four) hours as needed.    Marland Kitchen aspirin EC 81 MG tablet Take 1 tablet by mouth daily.    . Biotin 1 MG CAPS Take by mouth.    . carisoprodol (SOMA) 350 MG tablet Take 1 tablet by mouth 4 (four) times daily as needed.    . fluticasone (FLONASE) 50 MCG/ACT nasal spray Place 1 spray into both nostrils daily.    Marland Kitchen ibuprofen (ADVIL,MOTRIN) 200 MG tablet Take 1 tablet by mouth as needed.    . lactulose (CHRONULAC) 10 GM/15ML solution Take 45 mLs (30 g total) by mouth 2 (two) times daily as needed for mild constipation or moderate constipation. 240 mL 0  . meloxicam (MOBIC) 7.5 MG tablet Take  1 tablet by mouth daily as needed.    . Multiple Vitamin (MULTI-VITAMINS) TABS Take 1 tablet by mouth daily.    Marland Kitchen omeprazole (PRILOSEC) 20 MG capsule Take 1 capsule by mouth daily.    . OnabotulinumtoxinA (BOTOX IJ) Inject 1 Dose as directed every 3 (three) months.    . ondansetron (ZOFRAN) 8 MG tablet Take 1 tablet (8 mg total) by mouth 2 (two) times daily as needed for refractory nausea / vomiting. 30 tablet 1  . prochlorperazine  (COMPAZINE) 10 MG tablet Take 1 tablet (10 mg total) by mouth every 6 (six) hours as needed (Nausea or vomiting). 30 tablet 1   No current facility-administered medications for this visit.     OBJECTIVE: Vitals:   06/22/16 1556  BP: 139/89  Pulse: (!) 106  Resp: 18  Temp: 98.1 F (36.7 C)     Body mass index is 29.4 kg/m.    ECOG FS:0 - Asymptomatic  General: Well-developed, well-nourished, no acute distress. Eyes: Pink conjunctiva, anicteric sclera. Breasts: Well healing surgical scar on right breast. Bilateral breast and axilla without lumps or masses. Lungs: Clear to auscultation bilaterally. Heart: Regular rate and rhythm. No rubs, murmurs, or gallops. Abdomen: Soft, nontender, nondistended. No organomegaly noted, normoactive bowel sounds. Musculoskeletal: No edema, cyanosis, or clubbing. Neuro: Alert, answering all questions appropriately. Cranial nerves grossly intact. Skin: No rashes or petechiae noted. Psych: Normal affect.   LAB RESULTS:  Lab Results  Component Value Date   NA 138 03/17/2016   K 4.0 03/17/2016   CL 106 03/17/2016   CO2 28 03/17/2016   GLUCOSE 96 03/17/2016   BUN 16 03/17/2016   CREATININE 0.62 03/17/2016   CALCIUM 9.1 03/17/2016   PROT 6.9 03/17/2016   ALBUMIN 3.8 03/17/2016   AST 24 03/17/2016   ALT 25 03/17/2016   ALKPHOS 50 03/17/2016   BILITOT 0.4 03/17/2016   GFRNONAA >60 03/17/2016   GFRAA >60 03/17/2016    Lab Results  Component Value Date   WBC 5.7 05/11/2016   NEUTROABS 4.3 03/17/2016   HGB 13.1 05/11/2016   HCT 38.7 05/11/2016   MCV 90.4 05/11/2016   PLT 278 05/11/2016     STUDIES: No results found.  ASSESSMENT: Pathologic stage Ia poorly differentiated triple negative carcinoma of the lower outer quadrant of the right breast.  PLAN:    1. Pathologic stage Ia poorly differentiated triple negative carcinoma of the lower outer quadrant of the right breast:  Final pathology confirmed breast primary. Although patient  is a stage I, she received 4 cycles adjuvant chemotherapy with Taxotere and Cytoxan completing on March 17, 2016. This was followed by XRT. Previously, CT of the chest, abdomen, and pelvis did not reveal any metastatic disease.  She does not require an aromatase inhibitor given the triple negative status of her disease. She will require a mammogram in August 2018. Return to clinic in 3 months for routine evaluation. 2. Genetic testing: Given patient's age and triple negative status of her tumor, will consider genetic testing in the future. 3. Constipation: Resolved.  Patient expressed understanding and was in agreement with this plan. She also understands that She can call clinic at any time with any questions, concerns, or complaints.   Cancer Staging Breast cancer of lower-outer quadrant of right female breast [facility]) Staging form: Breast, AJCC 7th Edition - Clinical stage from 12/20/2015: Stage IA (T1b, N0, M0) - Signed by Lloyd Huger, MD on 12/20/2015    Lloyd Huger, MD 06/28/16 9:02  PM

## 2016-06-22 ENCOUNTER — Inpatient Hospital Stay: Payer: 59 | Attending: Oncology | Admitting: Oncology

## 2016-06-22 VITALS — BP 139/89 | HR 106 | Temp 98.1°F | Resp 18 | Wt 176.7 lb

## 2016-06-22 DIAGNOSIS — Z9011 Acquired absence of right breast and nipple: Secondary | ICD-10-CM | POA: Diagnosis not present

## 2016-06-22 DIAGNOSIS — Z87442 Personal history of urinary calculi: Secondary | ICD-10-CM | POA: Diagnosis not present

## 2016-06-22 DIAGNOSIS — Z7982 Long term (current) use of aspirin: Secondary | ICD-10-CM | POA: Diagnosis not present

## 2016-06-22 DIAGNOSIS — Z171 Estrogen receptor negative status [ER-]: Secondary | ICD-10-CM | POA: Diagnosis not present

## 2016-06-22 DIAGNOSIS — C50511 Malignant neoplasm of lower-outer quadrant of right female breast: Secondary | ICD-10-CM | POA: Insufficient documentation

## 2016-06-22 DIAGNOSIS — Z923 Personal history of irradiation: Secondary | ICD-10-CM

## 2016-06-22 DIAGNOSIS — Z79899 Other long term (current) drug therapy: Secondary | ICD-10-CM | POA: Insufficient documentation

## 2016-06-22 DIAGNOSIS — K219 Gastro-esophageal reflux disease without esophagitis: Secondary | ICD-10-CM | POA: Diagnosis not present

## 2016-06-22 NOTE — Progress Notes (Signed)
Offers no complaints. States is feeling well. 

## 2016-06-29 DIAGNOSIS — G245 Blepharospasm: Secondary | ICD-10-CM | POA: Diagnosis not present

## 2016-07-06 ENCOUNTER — Ambulatory Visit: Payer: 59 | Admitting: Radiation Oncology

## 2016-07-13 ENCOUNTER — Ambulatory Visit
Admission: RE | Admit: 2016-07-13 | Discharge: 2016-07-13 | Disposition: A | Discharge status: Home / Self Care | Payer: 59 | Source: Ambulatory Visit | Attending: Radiation Oncology | Admitting: Radiation Oncology

## 2016-07-13 ENCOUNTER — Encounter: Payer: Self-pay | Admitting: Radiation Oncology

## 2016-07-13 VITALS — BP 125/89 | HR 99 | Temp 97.9°F | Resp 20 | Wt 178.0 lb

## 2016-07-13 DIAGNOSIS — Z171 Estrogen receptor negative status [ER-]: Secondary | ICD-10-CM | POA: Insufficient documentation

## 2016-07-13 DIAGNOSIS — Z923 Personal history of irradiation: Secondary | ICD-10-CM | POA: Diagnosis not present

## 2016-07-13 DIAGNOSIS — Z23 Encounter for immunization: Secondary | ICD-10-CM | POA: Diagnosis not present

## 2016-07-13 DIAGNOSIS — C50511 Malignant neoplasm of lower-outer quadrant of right female breast: Secondary | ICD-10-CM

## 2016-07-13 NOTE — Progress Notes (Signed)
Radiation Oncology Follow up Note  Name: Melanie Campos   Date:   07/13/2016 MRN:  [id number] DOB: 05/06/1965    This 51 y.o. female presents to the clinic today for one-month follow-up status post whole breast radiation to her right breast for stage I triple negative invasive mammary carcinoma.  REFERRING PROVIDER: Marinda Elk, MD  HPI: patient is a 51 year old female now out 1 month having completed whole breast radiation to her right breast for triple negative stage I invasive mammary carcinoma. She is seen today in routine follow-up and is doing well. She specifically denies breast tenderness cough or bone pain. She is not on antiestrogen therapy based on the triple negative nature of her disease..  COMPLICATIONS OF TREATMENT: none  FOLLOW UP COMPLIANCE: keeps appointments   PHYSICAL EXAM:  BP 125/89   Pulse 99   Temp 97.9 F (36.6 C)   Resp 20   Wt 178 lb 0.3 oz (80.7 kg)   BMI 29.62 kg/m  Lungs are clear to A&P cardiac examination essentially unremarkable with regular rate and rhythm. No dominant mass or nodularity is noted in either breast in 2 positions examined. Incision is well-healed. No axillary or supraclavicular adenopathy is appreciated. Cosmetic result is excellent. Well-developed well-nourished patient in NAD. HEENT reveals PERLA, EOMI, discs not visualized.  Oral cavity is clear. No oral mucosal lesions are identified. Neck is clear without evidence of cervical or supraclavicular adenopathy. Lungs are clear to A&P. Cardiac examination is essentially unremarkable with regular rate and rhythm without murmur rub or thrill. Abdomen is benign with no organomegaly or masses noted. Motor sensory and DTR levels are equal and symmetric in the upper and lower extremities. Cranial nerves II through XII are grossly intact. Proprioception is intact. No peripheral adenopathy or edema is identified. No motor or sensory levels are noted. Crude visual fields are within normal  range.  RADIOLOGY RESULTS: no current films for review  PLAN: present time she is one month out from whole breast radiation and is doing well. I'm please were overall progress. I've asked to see her back in 4-5 months for follow-up. Patient knows to call sooner with any concerns.  I would like to take this opportunity to thank you for allowing me to participate in the care of your patient.Armstead Peaks., MD

## 2016-09-14 DIAGNOSIS — Z23 Encounter for immunization: Secondary | ICD-10-CM | POA: Diagnosis not present

## 2016-09-19 DIAGNOSIS — G245 Blepharospasm: Secondary | ICD-10-CM | POA: Diagnosis not present

## 2016-09-21 DIAGNOSIS — Z0000 Encounter for general adult medical examination without abnormal findings: Secondary | ICD-10-CM | POA: Diagnosis not present

## 2016-09-21 DIAGNOSIS — C50511 Malignant neoplasm of lower-outer quadrant of right female breast: Secondary | ICD-10-CM | POA: Diagnosis not present

## 2016-09-21 DIAGNOSIS — K219 Gastro-esophageal reflux disease without esophagitis: Secondary | ICD-10-CM | POA: Diagnosis not present

## 2016-09-22 ENCOUNTER — Ambulatory Visit: Payer: 59 | Admitting: Oncology

## 2016-09-22 ENCOUNTER — Other Ambulatory Visit: Payer: Self-pay | Admitting: Physician Assistant

## 2016-09-22 DIAGNOSIS — Z171 Estrogen receptor negative status [ER-]: Principal | ICD-10-CM

## 2016-09-22 DIAGNOSIS — C50511 Malignant neoplasm of lower-outer quadrant of right female breast: Secondary | ICD-10-CM

## 2016-09-26 ENCOUNTER — Other Ambulatory Visit: Payer: Self-pay | Admitting: Physician Assistant

## 2016-09-26 DIAGNOSIS — Z171 Estrogen receptor negative status [ER-]: Principal | ICD-10-CM

## 2016-09-26 DIAGNOSIS — C50511 Malignant neoplasm of lower-outer quadrant of right female breast: Secondary | ICD-10-CM

## 2016-09-27 NOTE — Progress Notes (Signed)
[place]  [phone]) [phone] Fax:(336) [phone]  ID: Manfred Arch OB: 1966/02/25  MR#: [id number]  ZSW#:[id number]  Patient Care Team: Marinda Elk, MD as PCP - General (Physician Assistant) Leonie Green, MD as Referring Physician (Surgery) Lloyd Huger, MD as Consulting Physician (Oncology) Rico Junker, RN as Oncology Nurse Navigator Noreene Filbert, MD as Referring Physician (Radiation Oncology)  CHIEF COMPLAINT: Pathologic stage Ia poorly differentiated triple negative carcinoma of the lower outer quadrant of the right breast.  INTERVAL HISTORY: Patient returns to clinic today for routine 3 month follow-up. She currently feels well and is asymptomatic. She has no neurologic complaints. She denies any recent fevers or illnesses. She has a good appetite and denies weight loss. She has no chest pain or shortness of breath. She denies any nausea, vomiting, or diarrhea. She has no urinary complaints. Patient offers no specific complaints today.  REVIEW OF SYSTEMS:   Review of Systems  Constitutional: Negative.  Negative for fever, malaise/fatigue and weight loss.  Respiratory: Negative.  Negative for cough, hemoptysis and shortness of breath.   Cardiovascular: Negative.  Negative for chest pain and leg swelling.  Gastrointestinal: Negative for abdominal pain, blood in stool, constipation and melena.  Genitourinary: Negative.   Musculoskeletal: Negative.   Skin: Negative.  Negative for rash.  Neurological: Negative.  Negative for sensory change and weakness.  Psychiatric/Behavioral: Negative.  The patient is not nervous/anxious.     As per HPI. Otherwise, a complete review of systems is negative.  PAST MEDICAL HISTORY: Past Medical History:  Diagnosis Date  . Arthritis    in neck  . Breast cancer, right breast ([place]) 11/18/2015  . GERD (gastroesophageal reflux disease)   . Kidney stones   . Last menstrual period (LMP) > 10  days ago 1995    PAST SURGICAL HISTORY: Past Surgical History:  Procedure Laterality Date  . ABDOMINAL HYSTERECTOMY  1995   partial  . BREAST BIOPSY Right 2017  . BREAST EXCISIONAL BIOPSY Right 12/08/2015   lumpectomy  . COLONOSCOPY  2016  . PARTIAL MASTECTOMY WITH NEEDLE LOCALIZATION Right 12/08/2015   Procedure: PARTIAL MASTECTOMY WITH NEEDLE LOCALIZATION;  Surgeon: Leonie Green, MD;  Location: [facility] ORS;  Service: General;  Laterality: Right;  . SENTINEL NODE BIOPSY Right 12/08/2015   Procedure: SENTINEL NODE BIOPSY;  Surgeon: Leonie Green, MD;  Location: [facility] ORS;  Service: General;  Laterality: Right;    FAMILY HISTORY: Family History  Problem Relation Age of Onset  . Breast cancer Neg Hx     ADVANCED DIRECTIVES (Y/N):  N  HEALTH MAINTENANCE: Social History  Substance Use Topics  . Smoking status: Never Smoker  . Smokeless tobacco: Never Used  . Alcohol use No     Colonoscopy:  PAP:  Bone density:  Lipid panel:  No Known Allergies  Current Outpatient Prescriptions  Medication Sig Dispense Refill  . acetaminophen (TYLENOL) 325 MG tablet Take 2 tablets by mouth every 4 (four) hours as needed.    Marland Kitchen aspirin EC 81 MG tablet Take 1 tablet by mouth daily.    . Biotin 1 MG CAPS Take by mouth.    . carisoprodol (SOMA) 350 MG tablet Take 1 tablet by mouth 4 (four) times daily as needed.    Marland Kitchen ibuprofen (ADVIL,MOTRIN) 200 MG tablet Take 1 tablet by mouth as needed.    . meloxicam (MOBIC) 7.5 MG tablet Take 1 tablet by mouth daily as needed.    . Multiple Vitamin (MULTI-VITAMINS) TABS  Take 1 tablet by mouth daily.    Marland Kitchen omeprazole (PRILOSEC) 20 MG capsule Take 1 capsule by mouth daily.    . OnabotulinumtoxinA (BOTOX IJ) Inject 1 Dose as directed every 3 (three) months.    . fluticasone (FLONASE) 50 MCG/ACT nasal spray Place 1 spray into both nostrils daily.    Marland Kitchen lactulose (CHRONULAC) 10 GM/15ML solution Take 45 mLs (30 g total) by mouth 2 (two) times daily as  needed for mild constipation or moderate constipation. (Patient not taking: Reported on 09/28/2016) 240 mL 0  . ondansetron (ZOFRAN) 8 MG tablet Take 1 tablet (8 mg total) by mouth 2 (two) times daily as needed for refractory nausea / vomiting. (Patient not taking: Reported on 09/28/2016) 30 tablet 1  . prochlorperazine (COMPAZINE) 10 MG tablet Take 1 tablet (10 mg total) by mouth every 6 (six) hours as needed (Nausea or vomiting). (Patient not taking: Reported on 09/28/2016) 30 tablet 1   No current facility-administered medications for this visit.     OBJECTIVE: Vitals:   09/28/16 1515  Pulse: 73  Resp: 20  Temp: (!) 96.3 F (35.7 C)     Body mass index is 30.09 kg/m.    ECOG FS:0 - Asymptomatic  General: Well-developed, well-nourished, no acute distress. Eyes: Pink conjunctiva, anicteric sclera. Breasts: Well healing surgical scar on right breast. Bilateral breast and axilla without lumps or masses. Lungs: Clear to auscultation bilaterally. Heart: Regular rate and rhythm. No rubs, murmurs, or gallops. Abdomen: Soft, nontender, nondistended. No organomegaly noted, normoactive bowel sounds. Musculoskeletal: No edema, cyanosis, or clubbing. Neuro: Alert, answering all questions appropriately. Cranial nerves grossly intact. Skin: No rashes or petechiae noted. Psych: Normal affect.   LAB RESULTS:  Lab Results  Component Value Date   NA 138 03/17/2016   K 4.0 03/17/2016   CL 106 03/17/2016   CO2 28 03/17/2016   GLUCOSE 96 03/17/2016   BUN 16 03/17/2016   CREATININE 0.62 03/17/2016   CALCIUM 9.1 03/17/2016   PROT 6.9 03/17/2016   ALBUMIN 3.8 03/17/2016   AST 24 03/17/2016   ALT 25 03/17/2016   ALKPHOS 50 03/17/2016   BILITOT 0.4 03/17/2016   GFRNONAA >60 03/17/2016   GFRAA >60 03/17/2016    Lab Results  Component Value Date   WBC 5.7 05/11/2016   NEUTROABS 4.3 03/17/2016   HGB 13.1 05/11/2016   HCT 38.7 05/11/2016   MCV 90.4 05/11/2016   PLT 278 05/11/2016      STUDIES: No results found.  ASSESSMENT: Pathologic stage Ia poorly differentiated triple negative carcinoma of the lower outer quadrant of the right breast.  PLAN:    1. Pathologic stage Ia poorly differentiated triple negative carcinoma of the lower outer quadrant of the right breast:  Final pathology confirmed breast primary. Although patient is a stage I, she received 4 cycles adjuvant chemotherapy with Taxotere and Cytoxan completing on March 17, 2016. This was followed by XRT. Previously, CT of the chest, abdomen, and pelvis did not reveal any metastatic disease.  She does not require an aromatase inhibitor given the triple negative status of her disease. Mammogram scheduled for  August 2018. Return to clinic in 3 months for routine evaluation. 2. Genetic testing: Given patient's age and triple negative status of her tumor, will consider genetic testing in the future. She does not have any children. She has one brother and he does not have any children. No family history.  3. Constipation: Resolved.  Patient expressed understanding and was in agreement with this  plan. She also understands that She can call clinic at any time with any questions, concerns, or complaints.   Cancer Staging Breast cancer of lower-outer quadrant of right female breast [facility]) Staging form: Breast, AJCC 7th Edition - Clinical stage from 12/20/2015: Stage IA (T1b, N0, M0) - Signed by Lloyd Huger, MD on 12/20/2015    Jacquelin Hawking, NP 09/28/16 3:31 PM

## 2016-09-28 ENCOUNTER — Inpatient Hospital Stay: Payer: 59

## 2016-09-28 ENCOUNTER — Inpatient Hospital Stay: Payer: 59 | Attending: Oncology | Admitting: Oncology

## 2016-09-28 VITALS — HR 73 | Temp 96.3°F | Resp 20 | Wt 180.8 lb

## 2016-09-28 DIAGNOSIS — Z9011 Acquired absence of right breast and nipple: Secondary | ICD-10-CM

## 2016-09-28 DIAGNOSIS — Z7982 Long term (current) use of aspirin: Secondary | ICD-10-CM | POA: Insufficient documentation

## 2016-09-28 DIAGNOSIS — C50511 Malignant neoplasm of lower-outer quadrant of right female breast: Secondary | ICD-10-CM | POA: Diagnosis not present

## 2016-09-28 DIAGNOSIS — Z171 Estrogen receptor negative status [ER-]: Secondary | ICD-10-CM | POA: Diagnosis not present

## 2016-09-28 DIAGNOSIS — Z9221 Personal history of antineoplastic chemotherapy: Secondary | ICD-10-CM | POA: Diagnosis not present

## 2016-09-28 DIAGNOSIS — M1990 Unspecified osteoarthritis, unspecified site: Secondary | ICD-10-CM | POA: Insufficient documentation

## 2016-09-28 DIAGNOSIS — K219 Gastro-esophageal reflux disease without esophagitis: Secondary | ICD-10-CM | POA: Insufficient documentation

## 2016-09-28 DIAGNOSIS — Z87442 Personal history of urinary calculi: Secondary | ICD-10-CM | POA: Diagnosis not present

## 2016-09-28 NOTE — Progress Notes (Signed)
Survivorship Care Plan visit completed.  Treatment summary reviewed and given to patient.  ASCO answers booklet reviewed and given to patient.  CARE program and Cancer Transitions discussed with patient along with other resources cancer center offers to patients and caregivers.  Patient verbalized understanding.    

## 2016-09-28 NOTE — Progress Notes (Signed)
Patient denies any concerns today.  

## 2016-09-29 DIAGNOSIS — Z0000 Encounter for general adult medical examination without abnormal findings: Secondary | ICD-10-CM | POA: Diagnosis not present

## 2016-10-28 ENCOUNTER — Ambulatory Visit
Admission: RE | Admit: 2016-10-28 | Discharge: 2016-10-28 | Disposition: A | Discharge status: Home / Self Care | Payer: 59 | Source: Ambulatory Visit | Attending: Physician Assistant | Admitting: Physician Assistant

## 2016-10-28 DIAGNOSIS — R922 Inconclusive mammogram: Secondary | ICD-10-CM | POA: Diagnosis not present

## 2016-10-28 DIAGNOSIS — Z171 Estrogen receptor negative status [ER-]: Principal | ICD-10-CM

## 2016-10-28 DIAGNOSIS — C50511 Malignant neoplasm of lower-outer quadrant of right female breast: Secondary | ICD-10-CM

## 2016-10-28 DIAGNOSIS — Z853 Personal history of malignant neoplasm of breast: Secondary | ICD-10-CM | POA: Insufficient documentation

## 2016-10-28 HISTORY — DX: Malignant neoplasm of unspecified site of unspecified female breast: C50.919

## 2016-12-14 DIAGNOSIS — G245 Blepharospasm: Secondary | ICD-10-CM | POA: Diagnosis not present

## 2016-12-19 NOTE — Progress Notes (Signed)
[place]  Telephone:(336) [phone] Fax:(336) [phone]  ID: Manfred Arch OB: [birth year]  MR#: [id number]  OEV#:[id number]  Patient Care Team: Marinda Elk, MD as PCP - General (Physician Assistant) Leonie Green, MD as Referring Physician (Surgery) Lloyd Huger, MD as Consulting Physician (Oncology) Rico Junker, RN as Oncology Nurse Navigator Noreene Filbert, MD as Referring Physician (Radiation Oncology)  CHIEF COMPLAINT: Pathologic stage Ia poorly differentiated triple negative carcinoma of the lower outer quadrant of the right breast.  INTERVAL HISTORY: Patient returns to clinic today for routine 3 month follow-up. She continues to feel well and is asymptomatic. She has no neurologic complaints. She denies any recent fevers or illnesses. She has a good appetite and denies weight loss. She has no chest pain or shortness of breath. She denies any nausea, vomiting, or diarrhea. She has no urinary complaints. Patient offers no specific complaints today.  REVIEW OF SYSTEMS:   Review of Systems  Constitutional: Negative.  Negative for fever, malaise/fatigue and weight loss.  Respiratory: Negative.  Negative for cough, hemoptysis and shortness of breath.   Cardiovascular: Negative.  Negative for chest pain and leg swelling.  Gastrointestinal: Negative for abdominal pain, blood in stool, constipation and melena.  Genitourinary: Negative.   Musculoskeletal: Negative.   Skin: Negative.  Negative for rash.  Neurological: Negative.  Negative for sensory change and weakness.  Psychiatric/Behavioral: Negative.  The patient is not nervous/anxious.     As per HPI. Otherwise, a complete review of systems is negative.  PAST MEDICAL HISTORY: Past Medical History:  Diagnosis Date  . Arthritis    in neck  . Breast cancer ([place]) 2017   right breast/chemo and radiation  . Breast cancer, right breast ([place]) 11/18/2015  . GERD (gastroesophageal reflux  disease)   . Kidney stones   . Last menstrual period (LMP) > 10 days ago 1995    PAST SURGICAL HISTORY: Past Surgical History:  Procedure Laterality Date  . ABDOMINAL HYSTERECTOMY  1995   partial  . BREAST BIOPSY Right 2017  . BREAST EXCISIONAL BIOPSY Right 12/08/2015   lumpectomy  . COLONOSCOPY  2016  . PARTIAL MASTECTOMY WITH NEEDLE LOCALIZATION Right 12/08/2015   Procedure: PARTIAL MASTECTOMY WITH NEEDLE LOCALIZATION;  Surgeon: Leonie Green, MD;  Location: [facility] ORS;  Service: General;  Laterality: Right;  . SENTINEL NODE BIOPSY Right 12/08/2015   Procedure: SENTINEL NODE BIOPSY;  Surgeon: Leonie Green, MD;  Location: [facility] ORS;  Service: General;  Laterality: Right;    FAMILY HISTORY: Family History  Problem Relation Age of Onset  . Breast cancer Neg Hx     ADVANCED DIRECTIVES (Y/N):  N  HEALTH MAINTENANCE: Social History  Substance Use Topics  . Smoking status: Never Smoker  . Smokeless tobacco: Never Used  . Alcohol use No     Colonoscopy:  PAP:  Bone density:  Lipid panel:  No Known Allergies  Current Outpatient Prescriptions  Medication Sig Dispense Refill  . acetaminophen (TYLENOL) 325 MG tablet Take 2 tablets by mouth every 4 (four) hours as needed.    Marland Kitchen aspirin EC 81 MG tablet Take 1 tablet by mouth daily.    . Biotin 1 MG CAPS Take by mouth.    . carisoprodol (SOMA) 350 MG tablet Take 1 tablet by mouth 4 (four) times daily as needed.    . fluticasone (FLONASE) 50 MCG/ACT nasal spray Place 1 spray into both nostrils daily.    Marland Kitchen ibuprofen (ADVIL,MOTRIN) 200 MG tablet Take 1  tablet by mouth as needed.    . lactulose (CHRONULAC) 10 GM/15ML solution Take 45 mLs (30 g total) by mouth 2 (two) times daily as needed for mild constipation or moderate constipation. 240 mL 0  . meloxicam (MOBIC) 7.5 MG tablet Take 1 tablet by mouth daily as needed.    . Multiple Vitamin (MULTI-VITAMINS) TABS Take 1 tablet by mouth daily.    Marland Kitchen omeprazole (PRILOSEC) 20 MG  capsule Take 1 capsule by mouth daily.    . OnabotulinumtoxinA (BOTOX IJ) Inject 1 Dose as directed every 3 (three) months.    . ondansetron (ZOFRAN) 8 MG tablet Take 1 tablet (8 mg total) by mouth 2 (two) times daily as needed for refractory nausea / vomiting. (Patient not taking: Reported on 09/28/2016) 30 tablet 1  . prochlorperazine (COMPAZINE) 10 MG tablet Take 1 tablet (10 mg total) by mouth every 6 (six) hours as needed (Nausea or vomiting). (Patient not taking: Reported on 12/21/2016) 30 tablet 1   No current facility-administered medications for this visit.     OBJECTIVE: There were no vitals filed for this visit.   There is no height or weight on file to calculate BMI.    ECOG FS:0 - Asymptomatic  General: Well-developed, well-nourished, no acute distress. Eyes: Pink conjunctiva, anicteric sclera. Breasts: Exam performed by another provider this morning. Lungs: Clear to auscultation bilaterally. Heart: Regular rate and rhythm. No rubs, murmurs, or gallops. Abdomen: Soft, nontender, nondistended. No organomegaly noted, normoactive bowel sounds. Musculoskeletal: No edema, cyanosis, or clubbing. Neuro: Alert, answering all questions appropriately. Cranial nerves grossly intact. Skin: No rashes or petechiae noted. Psych: Normal affect.   LAB RESULTS:  Lab Results  Component Value Date   NA 138 03/17/2016   K 4.0 03/17/2016   CL 106 03/17/2016   CO2 28 03/17/2016   GLUCOSE 96 03/17/2016   BUN 16 03/17/2016   CREATININE 0.62 03/17/2016   CALCIUM 9.1 03/17/2016   PROT 6.9 03/17/2016   ALBUMIN 3.8 03/17/2016   AST 24 03/17/2016   ALT 25 03/17/2016   ALKPHOS 50 03/17/2016   BILITOT 0.4 03/17/2016   GFRNONAA >60 03/17/2016   GFRAA >60 03/17/2016    Lab Results  Component Value Date   WBC 5.7 05/11/2016   NEUTROABS 4.3 03/17/2016   HGB 13.1 05/11/2016   HCT 38.7 05/11/2016   MCV 90.4 05/11/2016   PLT 278 05/11/2016     STUDIES: No results found.  ASSESSMENT:  Pathologic stage Ia poorly differentiated triple negative carcinoma of the lower outer quadrant of the right breast.  PLAN:    1. Pathologic stage Ia poorly differentiated triple negative carcinoma of the lower outer quadrant of the right breast:  Final pathology confirmed breast primary. Although patient was a stage Ia, she received 4 cycles adjuvant chemotherapy with Taxotere and Cytoxan completing on March 17, 2016. This was followed by XRT. Previously, CT of the chest, abdomen, and pelvis did not reveal any metastatic disease.  She does not require an aromatase inhibitor given the triple negative status of her disease. Mammogram on October 28, 2016 was reported as BI-RADS 2, repeat in August 2019. Return to clinic in 6 months for routine evaluation. 2. Genetic testing: Given patient's age and triple negative status of her tumor, will consider genetic testing in the future. She does not have any children. She has one brother and he does not have any children. No family history.  3. Constipation: Resolved.   Approximately 20 minutes is spent in discussion of  which greater than 50% was consultation.  Patient expressed understanding and was in agreement with this plan. She also understands that She can call clinic at any time with any questions, concerns, or complaints.   Cancer Staging Breast cancer of lower-outer quadrant of right female breast [facility]) Staging form: Breast, AJCC 7th Edition - Clinical stage from 12/20/2015: Stage IA (T1b, N0, M0) - Signed by Lloyd Huger, MD on 12/20/2015    Lloyd Huger, MD 12/21/16 4:41 PM

## 2016-12-21 ENCOUNTER — Ambulatory Visit
Admission: RE | Admit: 2016-12-21 | Discharge: 2016-12-21 | Disposition: A | Discharge status: Home / Self Care | Payer: 59 | Source: Ambulatory Visit | Attending: Radiation Oncology | Admitting: Radiation Oncology

## 2016-12-21 ENCOUNTER — Encounter: Payer: Self-pay | Admitting: Radiation Oncology

## 2016-12-21 ENCOUNTER — Inpatient Hospital Stay: Payer: 59 | Attending: Oncology | Admitting: Oncology

## 2016-12-21 VITALS — BP 126/83 | HR 90 | Temp 98.0°F | Resp 20 | Wt 179.6 lb

## 2016-12-21 DIAGNOSIS — Z79899 Other long term (current) drug therapy: Secondary | ICD-10-CM | POA: Insufficient documentation

## 2016-12-21 DIAGNOSIS — Z7982 Long term (current) use of aspirin: Secondary | ICD-10-CM | POA: Insufficient documentation

## 2016-12-21 DIAGNOSIS — Z923 Personal history of irradiation: Secondary | ICD-10-CM | POA: Insufficient documentation

## 2016-12-21 DIAGNOSIS — K219 Gastro-esophageal reflux disease without esophagitis: Secondary | ICD-10-CM | POA: Insufficient documentation

## 2016-12-21 DIAGNOSIS — C50511 Malignant neoplasm of lower-outer quadrant of right female breast: Secondary | ICD-10-CM | POA: Diagnosis not present

## 2016-12-21 DIAGNOSIS — Z9221 Personal history of antineoplastic chemotherapy: Secondary | ICD-10-CM

## 2016-12-21 DIAGNOSIS — Z87442 Personal history of urinary calculi: Secondary | ICD-10-CM | POA: Insufficient documentation

## 2016-12-21 DIAGNOSIS — Z853 Personal history of malignant neoplasm of breast: Secondary | ICD-10-CM | POA: Insufficient documentation

## 2016-12-21 DIAGNOSIS — Z23 Encounter for immunization: Secondary | ICD-10-CM | POA: Diagnosis not present

## 2016-12-21 DIAGNOSIS — Z171 Estrogen receptor negative status [ER-]: Secondary | ICD-10-CM | POA: Diagnosis not present

## 2016-12-21 NOTE — Progress Notes (Signed)
Radiation Oncology Follow up Note  Name: Melanie Campos   Date:   12/21/2016 MRN:  [id number] DOB: [birth year]    This 51 y.o. female presents to the clinic today for six-month follow-up status post whole breast radiation to right breast for stage I triple negative invasive mammary carcinoma  REFERRING PROVIDER: Marinda Elk, MD  HPI: patient is a 51 year old female now seen 6 months having a wide local excision of the right breast for triple negative stage I invasive mammary carcinoma. She seen today in routine follow-up and is doing well. She specifically denies breast tenderness cough or bone pain. She had a mammogram back in August which was BI-RADS 2 benign which I have reviewed. She is not on antiestrogen therapy based on the triple negative nature of her disease..  COMPLICATIONS OF TREATMENT: none  FOLLOW UP COMPLIANCE: keeps appointments   PHYSICAL EXAM:  BP 126/83   Pulse 90   Temp 98 F (36.7 C)   Resp 20   Wt 179 lb 9 oz (81.4 kg)   BMI 29.88 kg/m  Lungs are clear to A&P cardiac examination essentially unremarkable with regular rate and rhythm. No dominant mass or nodularity is noted in either breast in 2 positions examined. Incision is well-healed. No axillary or supraclavicular adenopathy is appreciated. Cosmetic result is excellent. Well-developed well-nourished patient in NAD. HEENT reveals PERLA, EOMI, discs not visualized.  Oral cavity is clear. No oral mucosal lesions are identified. Neck is clear without evidence of cervical or supraclavicular adenopathy. Lungs are clear to A&P. Cardiac examination is essentially unremarkable with regular rate and rhythm without murmur rub or thrill. Abdomen is benign with no organomegaly or masses noted. Motor sensory and DTR levels are equal and symmetric in the upper and lower extremities. Cranial nerves II through XII are grossly intact. Proprioception is intact. No peripheral adenopathy or edema is identified. No motor or  sensory levels are noted. Crude visual fields are within normal range.  RADIOLOGY RESULTS: Mammograms are reviewed and compatible with the above-stated findings  PLAN: Present time she continues to do well with no evidence of disease. I've asked to see her back in 6 months and then will start once your follow-up visits. She knows to call sooner with any concerns.  I would like to take this opportunity to thank you for allowing me to participate in the care of your patient.Armstead Peaks., MD

## 2017-01-02 ENCOUNTER — Ambulatory Visit: Payer: 59 | Admitting: Oncology

## 2017-01-04 ENCOUNTER — Ambulatory Visit: Payer: 59 | Admitting: Oncology

## 2017-01-06 DIAGNOSIS — R1012 Left upper quadrant pain: Secondary | ICD-10-CM | POA: Diagnosis not present

## 2017-01-06 DIAGNOSIS — R1032 Left lower quadrant pain: Secondary | ICD-10-CM | POA: Diagnosis not present

## 2017-01-10 ENCOUNTER — Other Ambulatory Visit: Payer: Self-pay | Admitting: Physician Assistant

## 2017-01-11 ENCOUNTER — Other Ambulatory Visit: Payer: Self-pay | Admitting: Physician Assistant

## 2017-01-11 DIAGNOSIS — N200 Calculus of kidney: Secondary | ICD-10-CM

## 2017-01-11 DIAGNOSIS — R1084 Generalized abdominal pain: Secondary | ICD-10-CM

## 2017-01-12 ENCOUNTER — Ambulatory Visit
Admission: RE | Admit: 2017-01-12 | Discharge: 2017-01-12 | Disposition: A | Discharge status: Home / Self Care | Payer: 59 | Source: Ambulatory Visit | Attending: Physician Assistant | Admitting: Physician Assistant

## 2017-01-12 DIAGNOSIS — R1084 Generalized abdominal pain: Secondary | ICD-10-CM

## 2017-01-12 DIAGNOSIS — R109 Unspecified abdominal pain: Secondary | ICD-10-CM | POA: Diagnosis not present

## 2017-01-12 DIAGNOSIS — N200 Calculus of kidney: Secondary | ICD-10-CM | POA: Insufficient documentation

## 2017-01-12 MED ORDER — IOPAMIDOL (ISOVUE-300) INJECTION 61%
100.0000 mL | Freq: Once | INTRAVENOUS | Status: AC | PRN
  Administered 2017-01-12: 100 mL via INTRAVENOUS

## 2017-01-13 DIAGNOSIS — R1032 Left lower quadrant pain: Secondary | ICD-10-CM | POA: Diagnosis not present

## 2017-01-18 ENCOUNTER — Ambulatory Visit: Payer: 59

## 2017-01-19 ENCOUNTER — Ambulatory Visit: Payer: 59

## 2017-03-15 DIAGNOSIS — G245 Blepharospasm: Secondary | ICD-10-CM | POA: Diagnosis not present

## 2017-03-22 DIAGNOSIS — R1012 Left upper quadrant pain: Secondary | ICD-10-CM | POA: Diagnosis not present

## 2017-03-31 DIAGNOSIS — R1012 Left upper quadrant pain: Secondary | ICD-10-CM | POA: Diagnosis not present

## 2017-04-05 DIAGNOSIS — R1013 Epigastric pain: Secondary | ICD-10-CM | POA: Diagnosis not present

## 2017-04-05 DIAGNOSIS — R1012 Left upper quadrant pain: Secondary | ICD-10-CM | POA: Diagnosis not present

## 2017-05-17 DIAGNOSIS — K449 Diaphragmatic hernia without obstruction or gangrene: Secondary | ICD-10-CM | POA: Diagnosis not present

## 2017-05-17 DIAGNOSIS — R1012 Left upper quadrant pain: Secondary | ICD-10-CM | POA: Diagnosis not present

## 2017-05-17 DIAGNOSIS — K210 Gastro-esophageal reflux disease with esophagitis: Secondary | ICD-10-CM | POA: Diagnosis not present

## 2017-05-17 DIAGNOSIS — K2950 Unspecified chronic gastritis without bleeding: Secondary | ICD-10-CM | POA: Diagnosis not present

## 2017-05-17 DIAGNOSIS — K319 Disease of stomach and duodenum, unspecified: Secondary | ICD-10-CM | POA: Diagnosis not present

## 2017-05-17 DIAGNOSIS — D131 Benign neoplasm of stomach: Secondary | ICD-10-CM | POA: Diagnosis not present

## 2017-05-17 DIAGNOSIS — K228 Other specified diseases of esophagus: Secondary | ICD-10-CM | POA: Diagnosis not present

## 2017-05-17 DIAGNOSIS — K317 Polyp of stomach and duodenum: Secondary | ICD-10-CM | POA: Diagnosis not present

## 2017-05-17 DIAGNOSIS — K3189 Other diseases of stomach and duodenum: Secondary | ICD-10-CM | POA: Diagnosis not present

## 2017-05-24 ENCOUNTER — Ambulatory Visit: Payer: 59

## 2017-05-24 ENCOUNTER — Telehealth: Payer: Self-pay | Admitting: *Deleted

## 2017-05-24 DIAGNOSIS — R1010 Upper abdominal pain, unspecified: Secondary | ICD-10-CM

## 2017-05-24 DIAGNOSIS — C50111 Malignant neoplasm of central portion of right female breast: Secondary | ICD-10-CM

## 2017-05-24 DIAGNOSIS — Z171 Estrogen receptor negative status [ER-]: Principal | ICD-10-CM

## 2017-05-24 NOTE — Telephone Encounter (Signed)
Patient called to report that she is having pain in her left side and back at her kidney. She states she has seen her PCP and that she had a colonoscopy last week which made the pain worse and they cannot find out the reason for her pain. She is requesting that Dr Grayland Ormond drawn some labs on her to see if he can figure out why she is hurting. Please advise  I have printed the results of upper and lower endoscopies from the [facility] for you   ASSESSMENT AND PLAN:  1. Persistent luq pain/dyspepsia- likely ibs, however ddx includes gastritis/colonic issues/other. Continue the bentyl as prescribed- increase the omeprazole to bid due to recent nsaids. Schedule egd and colonoscopy for luminal evaluation. Procedure information given: indications, benefits, risks- including, but not limited to bleeding, infection, perforation, difficulty with sedation, were discussed with the patient, and they are agreeable to the procedure. 2. Follow up as needed after procedure, sooner if problems    Electronically signed by Ok Edwards, NP at 04/05/2017 11:03 AM EST

## 2017-05-24 NOTE — Telephone Encounter (Signed)
Patient coming in this afternoon for labs

## 2017-05-24 NOTE — Telephone Encounter (Signed)
thx for following up w this

## 2017-05-24 NOTE — Telephone Encounter (Signed)
Unable to reach patient and I left a voice mail for her to call back

## 2017-05-24 NOTE — Telephone Encounter (Signed)
This is unrelated to her breast cancer, but if she wants a lab only visit we can check a cbc, metc, lipase, amylase, ca27.29.  Thanks.

## 2017-05-31 DIAGNOSIS — K588 Other irritable bowel syndrome: Secondary | ICD-10-CM | POA: Diagnosis not present

## 2017-06-13 ENCOUNTER — Other Ambulatory Visit: Payer: Self-pay | Admitting: Gastroenterology

## 2017-06-13 DIAGNOSIS — R1084 Generalized abdominal pain: Secondary | ICD-10-CM

## 2017-06-14 DIAGNOSIS — G245 Blepharospasm: Secondary | ICD-10-CM | POA: Diagnosis not present

## 2017-06-16 ENCOUNTER — Ambulatory Visit
Admission: RE | Admit: 2017-06-16 | Discharge: 2017-06-16 | Disposition: A | Discharge status: Home / Self Care | Payer: 59 | Source: Ambulatory Visit | Attending: Gastroenterology | Admitting: Gastroenterology

## 2017-06-16 ENCOUNTER — Inpatient Hospital Stay
Admission: RE | Admit: 2017-06-16 | Discharge: 2017-06-16 | Disposition: A | Discharge status: Home / Self Care | Payer: Self-pay | Source: Other Acute Inpatient Hospital | Attending: *Deleted | Admitting: *Deleted

## 2017-06-16 DIAGNOSIS — R1084 Generalized abdominal pain: Secondary | ICD-10-CM

## 2017-06-16 DIAGNOSIS — R197 Diarrhea, unspecified: Secondary | ICD-10-CM | POA: Diagnosis not present

## 2017-06-16 LAB — GASTROINTESTINAL PANEL BY PCR, STOOL (REPLACES STOOL CULTURE)

## 2017-06-16 LAB — C DIFFICILE QUICK SCREEN W PCR REFLEX
C DIFFICILE (CDIFF) TOXIN: NEGATIVE
C DIFFICLE (CDIFF) ANTIGEN: NEGATIVE
C Diff interpretation: NOT DETECTED

## 2017-06-21 DIAGNOSIS — R1084 Generalized abdominal pain: Secondary | ICD-10-CM | POA: Diagnosis not present

## 2017-06-21 DIAGNOSIS — D3500 Benign neoplasm of unspecified adrenal gland: Secondary | ICD-10-CM | POA: Diagnosis not present

## 2017-06-26 NOTE — Progress Notes (Signed)
[place]  Telephone:([phone]) [phone] Fax:(336) [phone]  ID: Manfred Arch OB: 1965-12-29  MR#: [id number]  AOZ#:[id number]  Patient Care Team: Marinda Elk, MD as PCP - General (Physician Assistant) Leonie Green, MD as Referring Physician (Surgery) Lloyd Huger, MD as Consulting Physician (Oncology) Rico Junker, RN as Oncology Nurse Navigator Noreene Filbert, MD as Referring Physician (Radiation Oncology)  CHIEF COMPLAINT: Pathologic stage Ia poorly differentiated triple negative carcinoma of the lower outer quadrant of the right breast.  INTERVAL HISTORY: Patient returns to clinic today for routine six-month follow-up.  She continues to have left flank pain that has been extensively worked up in the past with no apparent etiology.  She otherwise feels well and is asymptomatic. She has no neurologic complaints. She denies any recent fevers or illnesses. She has a good appetite and denies weight loss. She has no chest pain or shortness of breath. She denies any nausea, vomiting, or diarrhea. She has no urinary complaints.  Patient feels at her baseline mental offers no further specific complaints today.  REVIEW OF SYSTEMS:   Review of Systems  Constitutional: Negative.  Negative for fever, malaise/fatigue and weight loss.  Respiratory: Negative.  Negative for cough, hemoptysis and shortness of breath.   Cardiovascular: Negative.  Negative for chest pain and leg swelling.  Gastrointestinal: Negative.  Negative for abdominal pain, blood in stool, constipation, diarrhea and melena.  Genitourinary: Positive for flank pain.  Skin: Negative.  Negative for rash.  Neurological: Negative.  Negative for sensory change, focal weakness and weakness.  Psychiatric/Behavioral: Negative.  The patient is not nervous/anxious.     As per HPI. Otherwise, a complete review of systems is negative.  PAST MEDICAL HISTORY: Past Medical History:  Diagnosis Date   . Arthritis    in neck  . Breast cancer ([place]) 2017   right breast/chemo and radiation  . Breast cancer, right breast ([place]) 11/18/2015  . GERD (gastroesophageal reflux disease)   . Kidney stones   . Last menstrual period (LMP) > 10 days ago 1995    PAST SURGICAL HISTORY: Past Surgical History:  Procedure Laterality Date  . ABDOMINAL HYSTERECTOMY  1995   partial  . BREAST BIOPSY Right 2017  . BREAST EXCISIONAL BIOPSY Right 12/08/2015   lumpectomy  . COLONOSCOPY  2016  . PARTIAL MASTECTOMY WITH NEEDLE LOCALIZATION Right 12/08/2015   Procedure: PARTIAL MASTECTOMY WITH NEEDLE LOCALIZATION;  Surgeon: Leonie Green, MD;  Location: [facility] ORS;  Service: General;  Laterality: Right;  . SENTINEL NODE BIOPSY Right 12/08/2015   Procedure: SENTINEL NODE BIOPSY;  Surgeon: Leonie Green, MD;  Location: [facility] ORS;  Service: General;  Laterality: Right;    FAMILY HISTORY: Family History  Problem Relation Age of Onset  . Breast cancer Neg Hx     ADVANCED DIRECTIVES (Y/N):  N  HEALTH MAINTENANCE: Social History   Tobacco Use  . Smoking status: Never Smoker  . Smokeless tobacco: Never Used  Substance Use Topics  . Alcohol use: No  . Drug use: No     Colonoscopy:  PAP:  Bone density:  Lipid panel:  No Known Allergies  Current Outpatient Medications  Medication Sig Dispense Refill  . aspirin EC 81 MG tablet Take 1 tablet by mouth daily.    . Biotin 1 MG CAPS Take by mouth.    . fluticasone (FLONASE) 50 MCG/ACT nasal spray Place 1 spray into both nostrils daily.    . Multiple Vitamin (MULTI-VITAMINS) TABS Take 1 tablet by  mouth daily.    Marland Kitchen omeprazole (PRILOSEC) 20 MG capsule Take 1 capsule by mouth daily.    Marland Kitchen acetaminophen (TYLENOL) 325 MG tablet Take 2 tablets by mouth every 4 (four) hours as needed.    Marland Kitchen amitriptyline (ELAVIL) 10 MG tablet Take by mouth.    . carisoprodol (SOMA) 350 MG tablet Take 1 tablet by mouth 4 (four) times daily as needed.    . cyclobenzaprine  (FLEXERIL) 5 MG tablet   0  . hyoscyamine (LEVSIN, ANASPAZ) 0.125 MG tablet   1  . ibuprofen (ADVIL,MOTRIN) 200 MG tablet Take 1 tablet by mouth as needed.    . lactulose (CHRONULAC) 10 GM/15ML solution Take 45 mLs (30 g total) by mouth 2 (two) times daily as needed for mild constipation or moderate constipation. (Patient not taking: Reported on 06/28/2017) 240 mL 0  . meloxicam (MOBIC) 7.5 MG tablet Take 1 tablet by mouth daily as needed.    . OnabotulinumtoxinA (BOTOX IJ) Inject 1 Dose as directed every 3 (three) months.    . ondansetron (ZOFRAN) 8 MG tablet Take 1 tablet (8 mg total) by mouth 2 (two) times daily as needed for refractory nausea / vomiting. (Patient not taking: Reported on 09/28/2016) 30 tablet 1  . prochlorperazine (COMPAZINE) 10 MG tablet Take 1 tablet (10 mg total) by mouth every 6 (six) hours as needed (Nausea or vomiting). (Patient not taking: Reported on 12/21/2016) 30 tablet 1   No current facility-administered medications for this visit.     OBJECTIVE: Vitals:   06/28/17 1508  BP: 116/74  Pulse: 99  Resp: 18  Temp: (!) 97 F (36.1 C)     Body mass index is 30.14 kg/m.    ECOG FS:0 - Asymptomatic  General: Well-developed, well-nourished, no acute distress. Eyes: Pink conjunctiva, anicteric sclera. Breast: Patient declined breast exam today. Lungs: Clear to auscultation bilaterally. Heart: Regular rate and rhythm. No rubs, murmurs, or gallops. Abdomen: Soft, nontender, nondistended. No organomegaly noted, normoactive bowel sounds. Musculoskeletal: No edema, cyanosis, or clubbing. Neuro: Alert, answering all questions appropriately. Cranial nerves grossly intact. Skin: No rashes or petechiae noted. Psych: Normal affect.   LAB RESULTS:  Lab Results  Component Value Date   NA 137 06/28/2017   K 4.3 06/28/2017   CL 103 06/28/2017   CO2 26 06/28/2017   GLUCOSE 104 (H) 06/28/2017   BUN 22 (H) 06/28/2017   CREATININE 0.56 06/28/2017   CALCIUM 9.2  06/28/2017   PROT 7.4 06/28/2017   ALBUMIN 4.2 06/28/2017   AST 19 06/28/2017   ALT 19 06/28/2017   ALKPHOS 60 06/28/2017   BILITOT 0.5 06/28/2017   GFRNONAA >60 06/28/2017   GFRAA >60 06/28/2017    Lab Results  Component Value Date   WBC 6.8 06/28/2017   NEUTROABS 4.8 06/28/2017   HGB 13.0 06/28/2017   HCT 38.5 06/28/2017   MCV 88.3 06/28/2017   PLT 301 06/28/2017     STUDIES: US Abdomen Complete  Result Date: 06/16/2017 CLINICAL DATA:  Generalized abdominal pain EXAM: ABDOMEN ULTRASOUND COMPLETE COMPARISON:  CT 01/12/2017 FINDINGS: Gallbladder: No gallstones or wall thickening visualized. No sonographic Murphy sign noted by sonographer. Common bile duct: Diameter: Normal caliber, 3 mm Liver: No focal lesion identified. Within normal limits in parenchymal echogenicity. Portal vein is patent on color Doppler imaging with normal direction of blood flow towards the liver. IVC: No abnormality visualized. Pancreas: Visualized portion unremarkable. Spleen: Size and appearance within normal limits. Right Kidney: Length: 12.1 cm. Echogenicity within normal limits.  No mass or hydronephrosis visualized. Left Kidney: Length: 12.9 cm. Echogenicity within normal limits. No mass or hydronephrosis visualized. Abdominal aorta: No aneurysm visualized. Other findings: None. IMPRESSION: Unremarkable abdominal ultrasound. Electronically Signed   By: Rolm Baptise M.D.   On: 06/16/2017 11:09    ASSESSMENT: Pathologic stage Ia poorly differentiated triple negative carcinoma of the lower outer quadrant of the right breast.  PLAN:    1. Pathologic stage Ia poorly differentiated triple negative carcinoma of the lower outer quadrant of the right breast:  Final pathology confirmed breast primary.  Patient completed 4 cycles adjuvant chemotherapy with Taxotere and Cytoxan on March 17, 2016. She also completed adjuvant XRT.  Previously, CT of the chest, abdomen, and pelvis did not reveal any metastatic disease.   She did not require an aromatase inhibitor given the triple negative status of her disease. Mammogram on October 28, 2016 was reported as BI-RADS 2, repeat in August 2019. Return to clinic in 6 months for routine evaluation. 2. Genetic testing: Given patient's age and triple negative status of her tumor, will consider genetic testing in the future. She does not have any children. She has one brother and he does not have any children. No family history.  3.  Flank pain: Unclear etiology.  Continue symptomatic treatment.  Approximately 20 minutes was spent in discussion of which greater than 50% was consultation.  Patient expressed understanding and was in agreement with this plan. She also understands that She can call clinic at any time with any questions, concerns, or complaints.   Cancer Staging Breast cancer of lower-outer quadrant of right female breast [facility]) Staging form: Breast, AJCC 7th Edition - Clinical stage from 12/20/2015: Stage IA (T1b, N0, M0) - Signed by Lloyd Huger, MD on 12/20/2015    Lloyd Huger, MD 07/01/17 7:28 AM

## 2017-06-27 DIAGNOSIS — D3502 Benign neoplasm of left adrenal gland: Secondary | ICD-10-CM | POA: Diagnosis not present

## 2017-06-28 ENCOUNTER — Inpatient Hospital Stay: Payer: 59 | Attending: Oncology

## 2017-06-28 ENCOUNTER — Ambulatory Visit
Admission: RE | Admit: 2017-06-28 | Discharge: 2017-06-28 | Disposition: A | Discharge status: Home / Self Care | Payer: 59 | Source: Ambulatory Visit | Attending: Radiation Oncology | Admitting: Radiation Oncology

## 2017-06-28 ENCOUNTER — Encounter: Payer: Self-pay | Admitting: Radiation Oncology

## 2017-06-28 ENCOUNTER — Other Ambulatory Visit: Payer: Self-pay

## 2017-06-28 ENCOUNTER — Inpatient Hospital Stay (HOSPITAL_BASED_OUTPATIENT_CLINIC_OR_DEPARTMENT_OTHER): Payer: 59 | Admitting: Oncology

## 2017-06-28 VITALS — BP 133/88 | HR 81 | Temp 97.8°F | Resp 20 | Wt 181.2 lb

## 2017-06-28 VITALS — BP 116/74 | HR 99 | Temp 97.0°F | Resp 18 | Wt 181.1 lb

## 2017-06-28 DIAGNOSIS — Z7982 Long term (current) use of aspirin: Secondary | ICD-10-CM | POA: Insufficient documentation

## 2017-06-28 DIAGNOSIS — C50511 Malignant neoplasm of lower-outer quadrant of right female breast: Secondary | ICD-10-CM | POA: Insufficient documentation

## 2017-06-28 DIAGNOSIS — Z923 Personal history of irradiation: Secondary | ICD-10-CM | POA: Diagnosis not present

## 2017-06-28 DIAGNOSIS — R1010 Upper abdominal pain, unspecified: Secondary | ICD-10-CM

## 2017-06-28 DIAGNOSIS — Z853 Personal history of malignant neoplasm of breast: Secondary | ICD-10-CM | POA: Insufficient documentation

## 2017-06-28 DIAGNOSIS — Z171 Estrogen receptor negative status [ER-]: Secondary | ICD-10-CM

## 2017-06-28 DIAGNOSIS — C50111 Malignant neoplasm of central portion of right female breast: Secondary | ICD-10-CM

## 2017-06-28 DIAGNOSIS — D3502 Benign neoplasm of left adrenal gland: Secondary | ICD-10-CM | POA: Diagnosis not present

## 2017-06-28 LAB — COMPREHENSIVE METABOLIC PANEL
ALBUMIN: 4.2 g/dL (ref 3.5–5.0)
ALK PHOS: 60 U/L (ref 38–126)
ALT: 19 U/L (ref 14–54)
ANION GAP: 8 (ref 5–15)
AST: 19 U/L (ref 15–41)
BUN: 22 mg/dL — ABNORMAL HIGH (ref 6–20)
CHLORIDE: 103 mmol/L (ref 101–111)
CO2: 26 mmol/L (ref 22–32)
CREATININE: 0.56 mg/dL (ref 0.44–1.00)
Calcium: 9.2 mg/dL (ref 8.9–10.3)
GFR calc non Af Amer: >60 mL/min (ref >60)
GLUCOSE: 104 mg/dL — AB (ref 65–99)
Potassium: 4.3 mmol/L (ref 3.5–5.1)
Sodium: 137 mmol/L (ref 135–145)
Total Bilirubin: 0.5 mg/dL (ref 0.3–1.2)
Total Protein: 7.4 g/dL (ref 6.5–8.1)

## 2017-06-28 LAB — CBC WITH DIFFERENTIAL/PLATELET
Basophils Absolute: 0 10*3/uL (ref 0–0.1)
Basophils Relative: 1 %
EOS ABS: 0.1 10*3/uL (ref 0–0.7)
EOS PCT: 2 %
HCT: 38.5 % (ref 35.0–47.0)
HEMOGLOBIN: 13 g/dL (ref 12.0–16.0)
LYMPHS ABS: 1.2 10*3/uL (ref 1.0–3.6)
Lymphocytes Relative: 18 %
MCH: 29.9 pg (ref 26.0–34.0)
MCHC: 33.9 g/dL (ref 32.0–36.0)
MCV: 88.3 fL (ref 80.0–100.0)
Monocytes Absolute: 0.6 10*3/uL (ref 0.2–0.9)
Monocytes Relative: 9 %
NEUTROS PCT: 70 %
Neutro Abs: 4.8 10*3/uL (ref 1.4–6.5)
PLATELETS: 301 10*3/uL (ref 150–440)
RBC: 4.36 MIL/uL (ref 3.80–5.20)
RDW: 13.6 % (ref 11.5–14.5)
WBC: 6.8 10*3/uL (ref 3.6–11.0)

## 2017-06-28 LAB — LIPASE, BLOOD: Lipase: 34 U/L (ref 11–51)

## 2017-06-28 LAB — AMYLASE: AMYLASE: 54 U/L (ref 28–100)

## 2017-06-28 NOTE — Progress Notes (Signed)
Radiation Oncology Follow up Note  Name: Melanie Campos   Date:   06/28/2017 MRN:  [id number] DOB: May 09, 1965    This 52 y.o. female presents to the clinic today for 1 year follow-up status post whole breast radiation to right breast for triple negative stage I invasive mammary carcinoma.  REFERRING PROVIDER: Marinda Elk, MD  HPI: patient is a 52 year old female now seen out 1 year having completed whole breast radiation to her right breast for stage I trouble negative invasive mammary carcinoma. Seen today in routine follow-up she is doing well. She specifically denies breast tenderness cough or bone pain.Marland Kitchenlast mammograms were back in August. She scheduled for follow-up mammograms this August. She is not on antiestrogen therapy based on the triple negative nature of her disease.  COMPLICATIONS OF TREATMENT: none  FOLLOW UP COMPLIANCE: keeps appointments   PHYSICAL EXAM:  BP 133/88   Pulse 81   Temp 97.8 F (36.6 C)   Resp 20   Wt 181 lb 3.5 oz (82.2 kg)   BMI 30.16 kg/m  Lungs are clear to A&P cardiac examination essentially unremarkable with regular rate and rhythm. No dominant mass or nodularity is noted in either breast in 2 positions examined. Incision is well-healed. No axillary or supraclavicular adenopathy is appreciated. Cosmetic result is excellent. Well-developed well-nourished patient in NAD. HEENT reveals PERLA, EOMI, discs not visualized.  Oral cavity is clear. No oral mucosal lesions are identified. Neck is clear without evidence of cervical or supraclavicular adenopathy. Lungs are clear to A&P. Cardiac examination is essentially unremarkable with regular rate and rhythm without murmur rub or thrill. Abdomen is benign with no organomegaly or masses noted. Motor sensory and DTR levels are equal and symmetric in the upper and lower extremities. Cranial nerves II through XII are grossly intact. Proprioception is intact. No peripheral adenopathy or edema is identified.  No motor or sensory levels are noted. Crude visual fields are within normal range.  RADIOLOGY RESULTS: no current films for review  PLAN: present time patient is doing well with no evidence of disease. I'm please were overall progress. I've asked to see her back in 1 year for follow-up. She or he has mammograms [place] ordered. Patient is to call with any concerns.  I would like to take this opportunity to thank you for allowing me to participate in the care of your patient.Noreene Filbert, MD

## 2017-06-28 NOTE — Progress Notes (Signed)
Here for follow up. Stated she has had intermittent pain L abd area -spasm ,and tight feeling. Has had ct-neg, colonoscopy and enodo -neg. Still unable to determine cause per pt. Pain level is 3 -better after meals she stated.

## 2017-06-29 LAB — CANCER ANTIGEN 27.29: CAN 27.29: 26.8 U/mL (ref 0.0–38.6)

## 2017-07-07 DIAGNOSIS — K588 Other irritable bowel syndrome: Secondary | ICD-10-CM | POA: Diagnosis not present

## 2017-08-25 DIAGNOSIS — G245 Blepharospasm: Secondary | ICD-10-CM | POA: Diagnosis not present

## 2017-09-12 IMAGING — MG MM DIGITAL DIAGNOSTIC UNILAT*R* W/ TOMO W/ CAD
8 of 13 series · 8 of 29 positions shown · non-contrast
Comparison: Previous exam(s).

CLINICAL DATA: Patient was called back from screening mammogram for
a possible mass and asymmetry in the right breast.

EXAM:
2D DIGITAL DIAGNOSTIC RIGHT MAMMOGRAM WITH CAD AND ADJUNCT TOMO
ULTRASOUND RIGHT BREAST

[R CC synth-2D (1 of 3)]
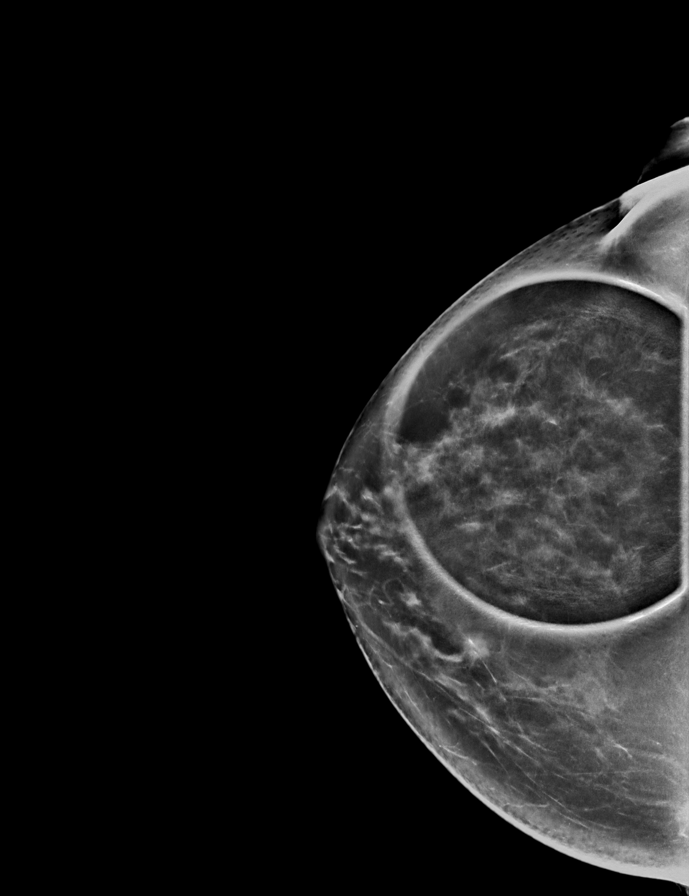

[R CC synth-2D (2 of 3)]
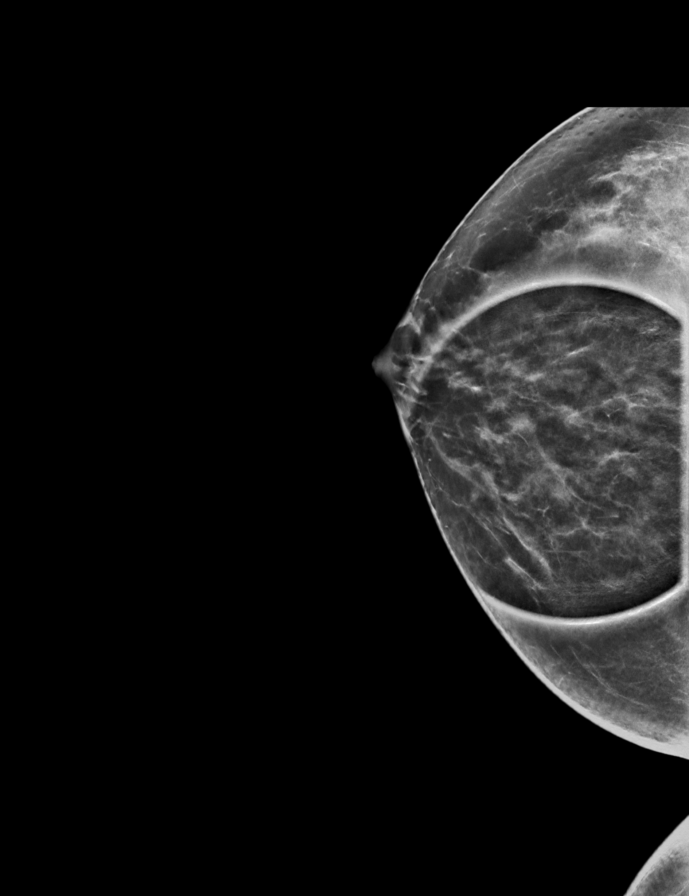

[R CC (1 of 3)]
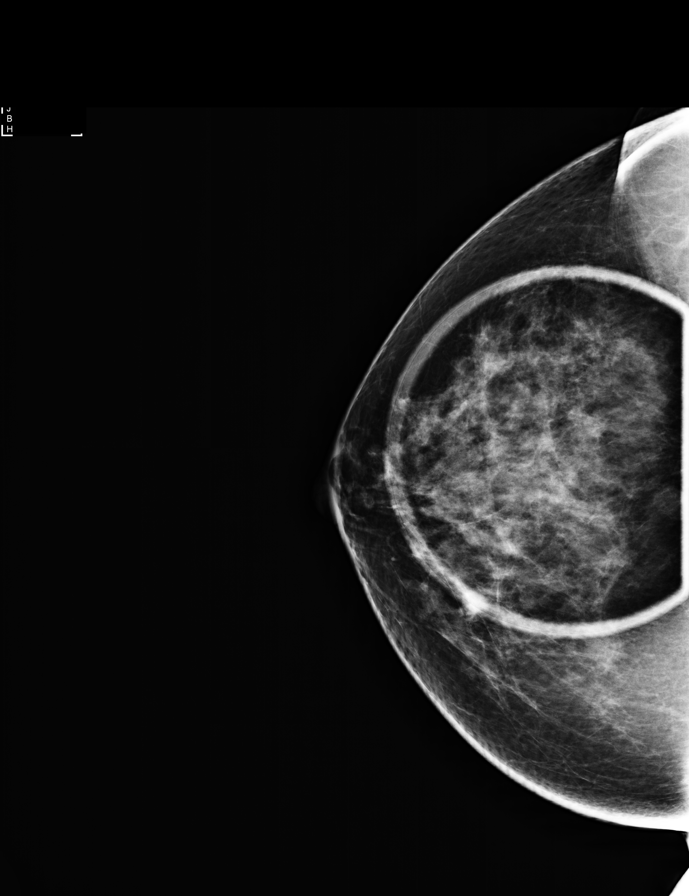

[R ML]
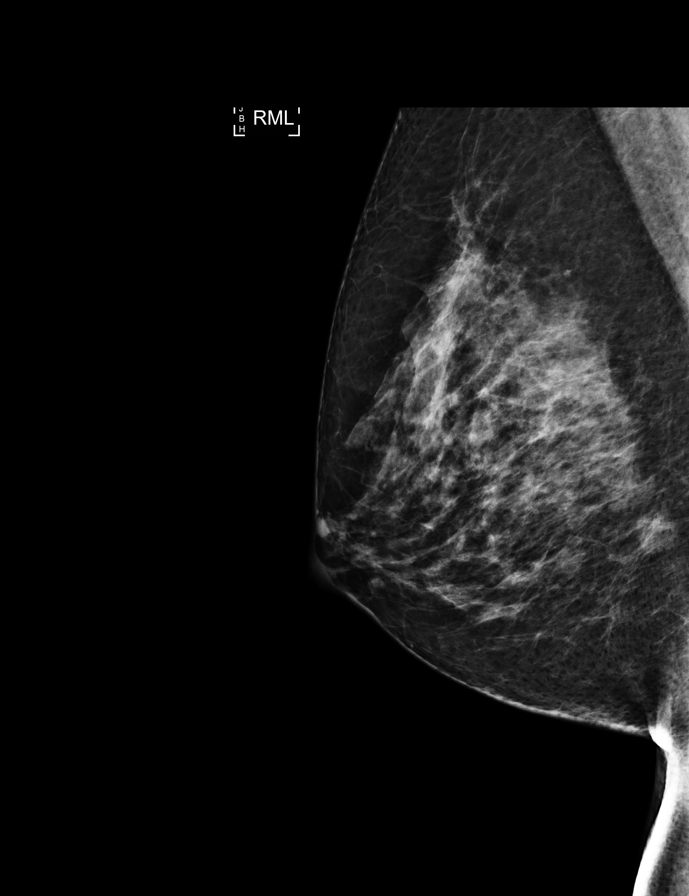

[R CC synth-2D (3 of 3)]
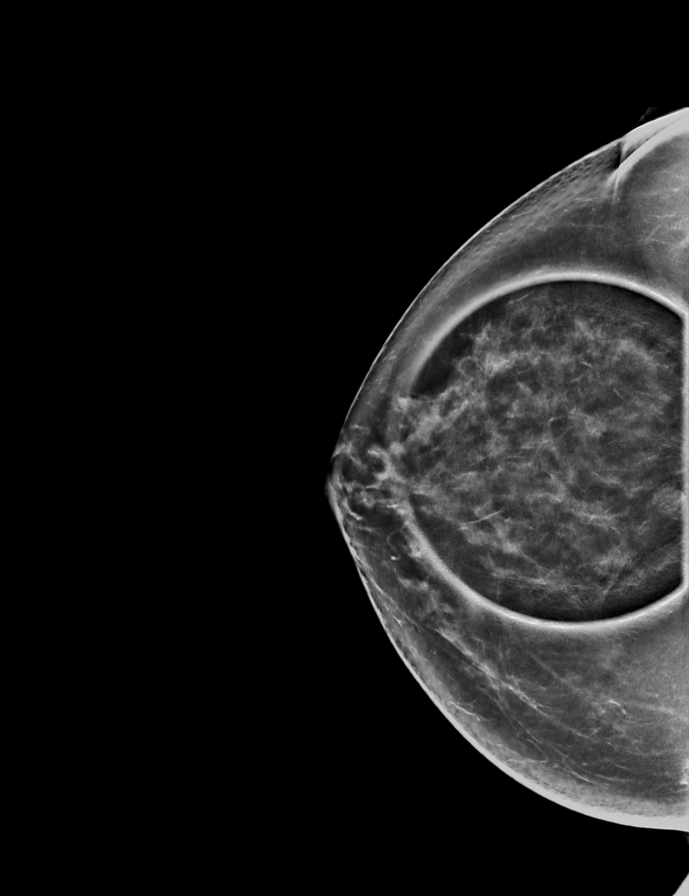

[R CC (2 of 3)]
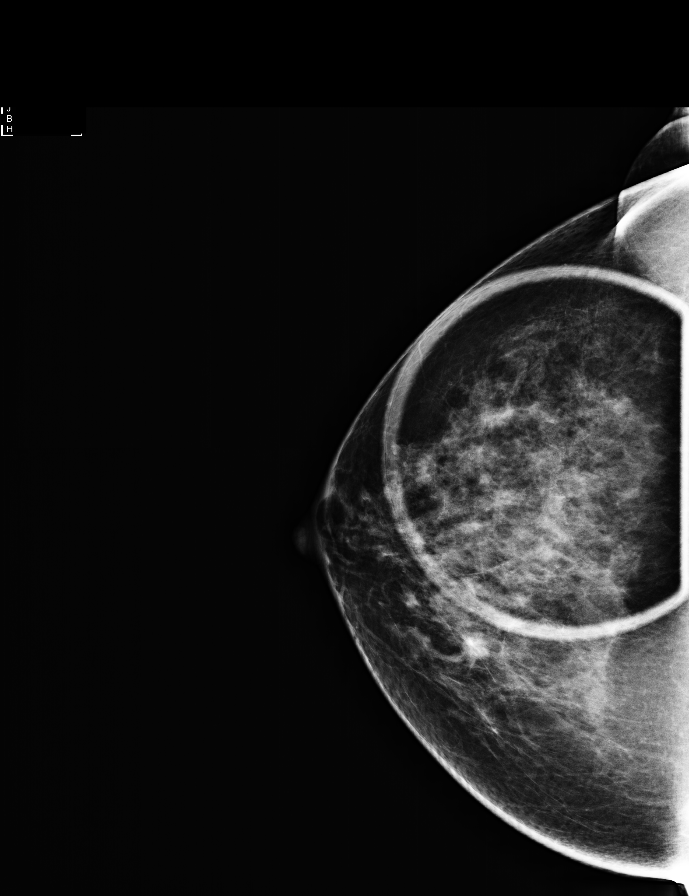

[R ML synth-2D]
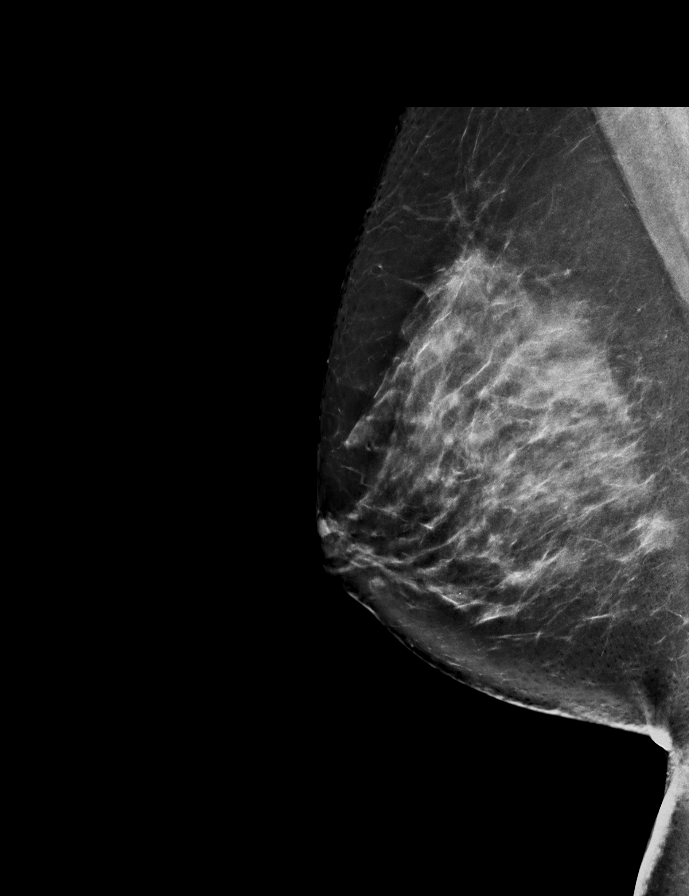

[R CC (3 of 3)]
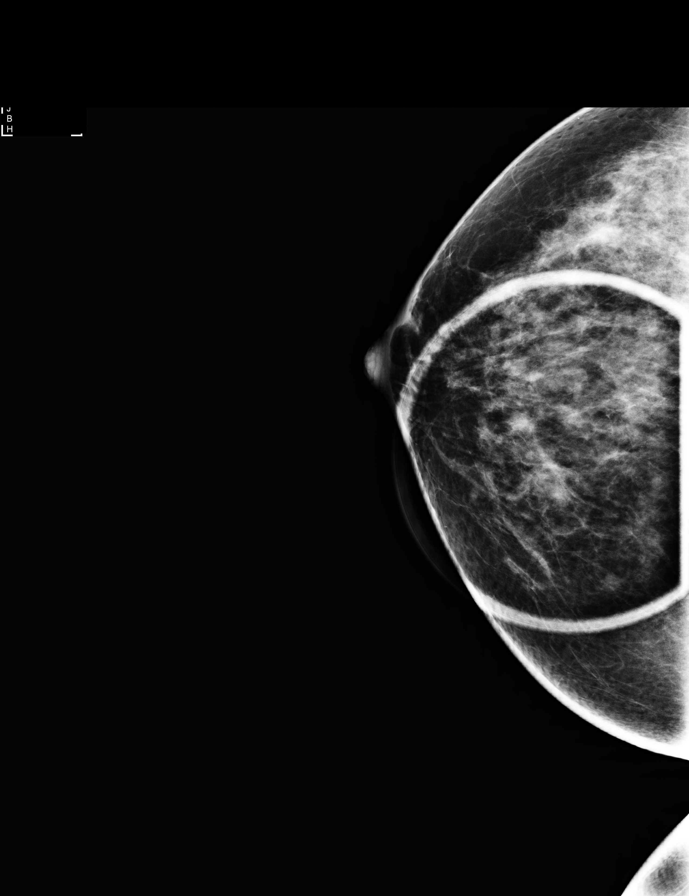

[8 of 29 positions shown; findings below may reference images not displayed]

ACR Breast Density Category c: The breast tissue is heterogeneously
dense, which may obscure small masses.
FINDINGS: Additional imaging of the right breast was performed showing there
is a 10 mm mass with obscured borders in the 6 o'clock region of the
breast. There are no malignant type microcalcifications.

Mammographic images were processed with CAD.

On physical exam, I do not palpate a mass in the 6 o'clock region of
the right breast.

Targeted ultrasound is performed, showing a hypoechoic mass with
well-defined borders in the 6 o'clock region the right breast 6 cm
from the nipple measuring 7 x 6 x 6 mm. There are internal echoes in
the mass and it does not meet the criteria of a simple cyst.
Sonographic evaluation of the right axilla does not show any
enlarged adenopathy.
IMPRESSION: Indeterminate mass in the 6 o'clock region the right breast.

RECOMMENDATION:
Attempt at ultrasound-guided aspiration of the mass in the 6 o'clock
region of the right breast is recommended. If the lesion does not
decompress with aspiration I would recommend an ultrasound-guided
core biopsy.

I have discussed the findings and recommendations with the patient.
Results were also provided in writing at the conclusion of the
visit. If applicable, a reminder letter will be sent to the patient
regarding the next appointment.

BI-RADS CATEGORY  4: Suspicious.

## 2017-09-13 DIAGNOSIS — G245 Blepharospasm: Secondary | ICD-10-CM | POA: Diagnosis not present

## 2017-09-27 DIAGNOSIS — Z0000 Encounter for general adult medical examination without abnormal findings: Secondary | ICD-10-CM | POA: Diagnosis not present

## 2017-09-27 DIAGNOSIS — K219 Gastro-esophageal reflux disease without esophagitis: Secondary | ICD-10-CM | POA: Diagnosis not present

## 2017-09-27 DIAGNOSIS — C50511 Malignant neoplasm of lower-outer quadrant of right female breast: Secondary | ICD-10-CM | POA: Diagnosis not present

## 2017-10-28 IMAGING — MG MM BREAST SURGICAL SPECIMEN
1 series · 1 of 1 positions shown · non-contrast
Comparison: Previous exam(s).

CLINICAL DATA: Patient presented for wire localization prior to
excisional biopsy of the right breast.

EXAM:
SPECIMEN RADIOGRAPH OF THE RIGHT BREAST

[R SPECIMEN]
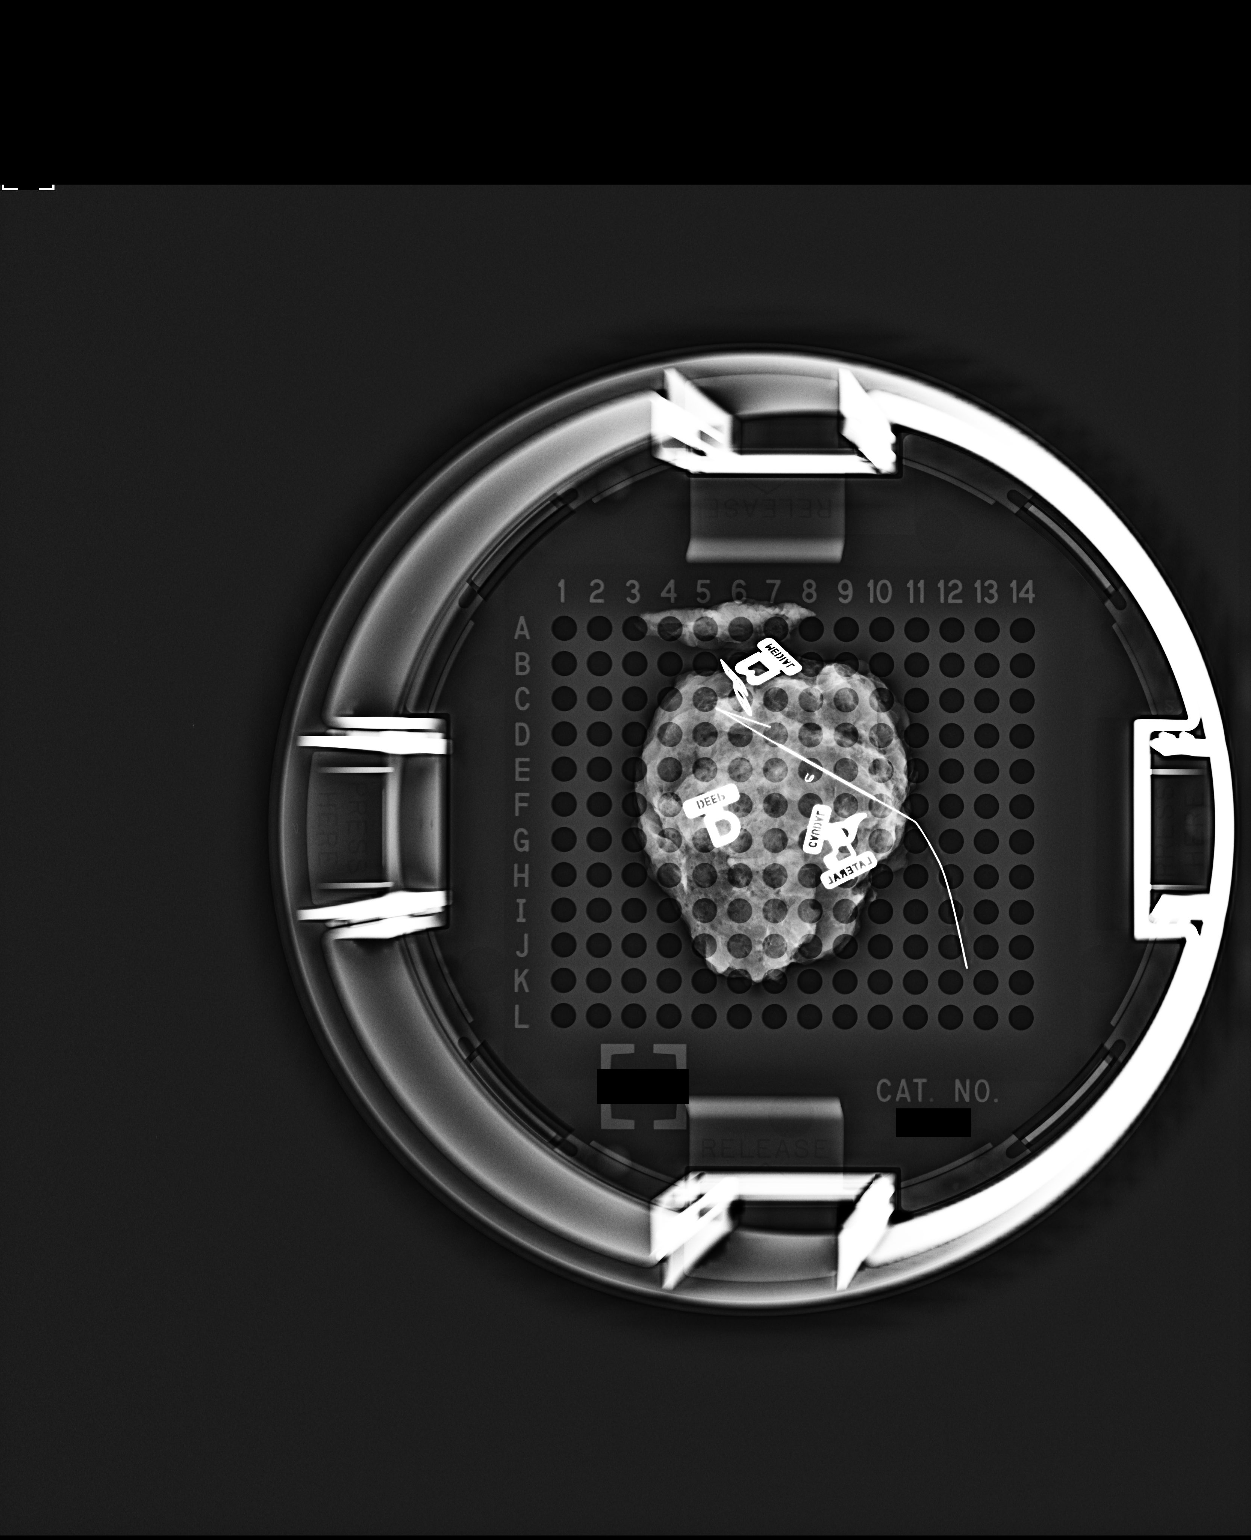

[1 of 1 positions shown; findings below may reference images not displayed]

FINDINGS: Status post excision of the right breast. The wire tip and biopsy
marker clip are present. Findings discussed by telephone with Dr.
Ourari.
IMPRESSION: Specimen radiograph of the right breast.

## 2017-11-01 ENCOUNTER — Ambulatory Visit
Admission: RE | Admit: 2017-11-01 | Discharge: 2017-11-01 | Disposition: A | Discharge status: Home / Self Care | Payer: 59 | Source: Ambulatory Visit | Attending: Oncology | Admitting: Oncology

## 2017-11-01 DIAGNOSIS — R922 Inconclusive mammogram: Secondary | ICD-10-CM | POA: Diagnosis not present

## 2017-11-01 DIAGNOSIS — C50511 Malignant neoplasm of lower-outer quadrant of right female breast: Secondary | ICD-10-CM

## 2017-11-01 DIAGNOSIS — Z853 Personal history of malignant neoplasm of breast: Secondary | ICD-10-CM | POA: Diagnosis not present

## 2017-11-01 DIAGNOSIS — Z171 Estrogen receptor negative status [ER-]: Principal | ICD-10-CM

## 2017-11-01 HISTORY — DX: Personal history of irradiation: Z92.3

## 2017-11-01 HISTORY — DX: Personal history of antineoplastic chemotherapy: Z92.21

## 2017-11-30 DIAGNOSIS — G245 Blepharospasm: Secondary | ICD-10-CM | POA: Diagnosis not present

## 2017-12-06 DIAGNOSIS — G245 Blepharospasm: Secondary | ICD-10-CM | POA: Diagnosis not present

## 2018-01-01 NOTE — Progress Notes (Signed)
[place]  Telephone:(336) [phone] Fax:(336) [phone]  ID: Melanie Campos OB: 11-Jul-1965  MR#: [id number]  MWU#:[id number]  Patient Care Team: Marinda Elk, MD as PCP - General (Physician Assistant) Leonie Green, MD as Referring Physician (Surgery) Lloyd Huger, MD as Consulting Physician (Oncology) Rico Junker, RN as Oncology Nurse Navigator Noreene Filbert, MD as Referring Physician (Radiation Oncology)  CHIEF COMPLAINT: Pathologic stage Ia poorly differentiated triple negative carcinoma of the lower outer quadrant of the right breast.  INTERVAL HISTORY: Patient returns to clinic today for routine six-month follow-up.  She currently feels well and is asymptomatic.  She does not complain of pain today. She has no neurologic complaints. She denies any recent fevers or illnesses. She has a good appetite and denies weight loss. She has no chest pain or shortness of breath. She denies any nausea, vomiting, or diarrhea. She has no urinary complaints.  Patient feels at her baseline offers no specific complaints today.  REVIEW OF SYSTEMS:   Review of Systems  Constitutional: Negative.  Negative for fever, malaise/fatigue and weight loss.  Respiratory: Negative.  Negative for cough, hemoptysis and shortness of breath.   Cardiovascular: Negative.  Negative for chest pain and leg swelling.  Gastrointestinal: Negative.  Negative for abdominal pain, blood in stool, constipation, diarrhea and melena.  Genitourinary: Negative.  Negative for flank pain.  Musculoskeletal: Negative.  Negative for back pain.  Skin: Negative.  Negative for rash.  Neurological: Negative.  Negative for sensory change, focal weakness and weakness.  Psychiatric/Behavioral: Negative.  The patient is not nervous/anxious.     As per HPI. Otherwise, a complete review of systems is negative.  PAST MEDICAL HISTORY: Past Medical History:  Diagnosis Date  . Arthritis    in neck   . Breast cancer ([place]) 2017   right breast/chemo and radiation  . Breast cancer, right breast ([place]) 11/18/2015  . GERD (gastroesophageal reflux disease)   . Kidney stones   . Last menstrual period (LMP) > 10 days ago 1995  . Personal history of chemotherapy 2017   Right breast  . Personal history of radiation therapy 2017   Right breast    PAST SURGICAL HISTORY: Past Surgical History:  Procedure Laterality Date  . ABDOMINAL HYSTERECTOMY  1995   partial  . BREAST BIOPSY Right 2017   Invasice mammory carcinoma  . BREAST EXCISIONAL BIOPSY Right 12/08/2015   lumpectomy  . COLONOSCOPY  2016  . PARTIAL MASTECTOMY WITH NEEDLE LOCALIZATION Right 12/08/2015   Procedure: PARTIAL MASTECTOMY WITH NEEDLE LOCALIZATION;  Surgeon: Leonie Green, MD;  Location: [facility] ORS;  Service: General;  Laterality: Right;  . SENTINEL NODE BIOPSY Right 12/08/2015   Procedure: SENTINEL NODE BIOPSY;  Surgeon: Leonie Green, MD;  Location: [facility] ORS;  Service: General;  Laterality: Right;    FAMILY HISTORY: Family History  Problem Relation Age of Onset  . Breast cancer Neg Hx     ADVANCED DIRECTIVES (Y/N):  N  HEALTH MAINTENANCE: Social History   Tobacco Use  . Smoking status: Never Smoker  . Smokeless tobacco: Never Used  Substance Use Topics  . Alcohol use: No  . Drug use: No     Colonoscopy:  PAP:  Bone density:  Lipid panel:  No Known Allergies  Current Outpatient Medications  Medication Sig Dispense Refill  . acetaminophen (TYLENOL) 325 MG tablet Take 2 tablets by mouth every 4 (four) hours as needed.    Marland Kitchen amitriptyline (ELAVIL) 10 MG tablet Take by mouth.    Marland Kitchen  aspirin EC 81 MG tablet Take 1 tablet by mouth daily.    . Biotin 1 MG CAPS Take by mouth.    . carisoprodol (SOMA) 350 MG tablet Take 1 tablet by mouth 4 (four) times daily as needed.    . cyclobenzaprine (FLEXERIL) 5 MG tablet   0  . fluticasone (FLONASE) 50 MCG/ACT nasal spray Place 1 spray into both nostrils  daily.    . hyoscyamine (LEVSIN, ANASPAZ) 0.125 MG tablet   1  . ibuprofen (ADVIL,MOTRIN) 200 MG tablet Take 1 tablet by mouth as needed.    . lactulose (CHRONULAC) 10 GM/15ML solution Take 45 mLs (30 g total) by mouth 2 (two) times daily as needed for mild constipation or moderate constipation. 240 mL 0  . meloxicam (MOBIC) 7.5 MG tablet Take 1 tablet by mouth daily as needed.    . Multiple Vitamin (MULTI-VITAMINS) TABS Take 1 tablet by mouth daily.    Marland Kitchen omeprazole (PRILOSEC) 20 MG capsule Take 1 capsule by mouth daily.    . OnabotulinumtoxinA (BOTOX IJ) Inject 1 Dose as directed every 3 (three) months.    . ondansetron (ZOFRAN) 8 MG tablet Take 1 tablet (8 mg total) by mouth 2 (two) times daily as needed for refractory nausea / vomiting. 30 tablet 1  . prochlorperazine (COMPAZINE) 10 MG tablet Take 1 tablet (10 mg total) by mouth every 6 (six) hours as needed (Nausea or vomiting). 30 tablet 1   No current facility-administered medications for this visit.     OBJECTIVE: Vitals:   01/03/18 1440 01/03/18 1444  BP:  (!) 136/98  Pulse:  88  Resp: 12   Temp:  97.6 F (36.4 C)     Body mass index is 30.54 kg/m.    ECOG FS:0 - Asymptomatic  General: Well-developed, well-nourished, no acute distress. Eyes: Pink conjunctiva, anicteric sclera. HEENT: Normocephalic, moist mucous membranes. Breast: Patient reports a normal breast exam by another provider recently. Lungs: Clear to auscultation bilaterally. Heart: Regular rate and rhythm. No rubs, murmurs, or gallops. Abdomen: Soft, nontender, nondistended. No organomegaly noted, normoactive bowel sounds. Musculoskeletal: No edema, cyanosis, or clubbing. Neuro: Alert, answering all questions appropriately. Cranial nerves grossly intact. Skin: No rashes or petechiae noted. Psych: Normal affect.  LAB RESULTS:  Lab Results  Component Value Date   NA 137 06/28/2017   K 4.3 06/28/2017   CL 103 06/28/2017   CO2 26 06/28/2017   GLUCOSE 104  (H) 06/28/2017   BUN 22 (H) 06/28/2017   CREATININE 0.56 06/28/2017   CALCIUM 9.2 06/28/2017   PROT 7.4 06/28/2017   ALBUMIN 4.2 06/28/2017   AST 19 06/28/2017   ALT 19 06/28/2017   ALKPHOS 60 06/28/2017   BILITOT 0.5 06/28/2017   GFRNONAA >60 06/28/2017   GFRAA >60 06/28/2017    Lab Results  Component Value Date   WBC 6.8 06/28/2017   NEUTROABS 4.8 06/28/2017   HGB 13.0 06/28/2017   HCT 38.5 06/28/2017   MCV 88.3 06/28/2017   PLT 301 06/28/2017     STUDIES: No results found.  ASSESSMENT: Pathologic stage Ia poorly differentiated triple negative carcinoma of the lower outer quadrant of the right breast.  PLAN:    1. Pathologic stage Ia poorly differentiated triple negative carcinoma of the lower outer quadrant of the right breast:  Final pathology confirmed breast primary.  Patient completed 4 cycles adjuvant chemotherapy with Taxotere and Cytoxan on March 17, 2016. She also completed adjuvant XRT.  Previously, CT of the chest, abdomen, and pelvis  did not reveal any metastatic disease.  She did not require an aromatase inhibitor given the triple negative status of her disease.  Her most recent mammogram on November 01, 2017 was reported as BI-RADS 2.  Repeat in August 2020.  Her CA 27-29 continues to be within normal limits at 23.2.  No intervention is needed at this time.  Return to clinic in 6 months for routine evaluation.   2. Genetic testing: Given patient's age and triple negative status of her tumor, will consider genetic testing in the future. She does not have any children. She has one brother and he does not have any children. No family history.  3.  Flank pain: Patient does not complain of this today.  I spent a total of 20 minutes face-to-face with the patient of which greater than 50% of the visit was spent in counseling and coordination of care as detailed above.   Patient expressed understanding and was in agreement with this plan. She also understands that She  can call clinic at any time with any questions, concerns, or complaints.   Cancer Staging Breast cancer of lower-outer quadrant of right female breast [facility]) Staging form: Breast, AJCC 7th Edition - Clinical stage from 12/20/2015: Stage IA (T1b, N0, M0) - Signed by Lloyd Huger, MD on 12/20/2015    Lloyd Huger, MD 01/05/18 3:32 PM

## 2018-01-03 ENCOUNTER — Inpatient Hospital Stay (HOSPITAL_BASED_OUTPATIENT_CLINIC_OR_DEPARTMENT_OTHER): Payer: 59 | Admitting: Oncology

## 2018-01-03 ENCOUNTER — Inpatient Hospital Stay: Payer: 59 | Attending: Oncology

## 2018-01-03 ENCOUNTER — Other Ambulatory Visit: Payer: Self-pay

## 2018-01-03 ENCOUNTER — Encounter: Payer: Self-pay | Admitting: Oncology

## 2018-01-03 VITALS — BP 136/98 | HR 88 | Temp 97.6°F | Resp 12 | Ht 65.0 in | Wt 183.5 lb

## 2018-01-03 DIAGNOSIS — C50111 Malignant neoplasm of central portion of right female breast: Secondary | ICD-10-CM

## 2018-01-03 DIAGNOSIS — Z923 Personal history of irradiation: Secondary | ICD-10-CM | POA: Diagnosis not present

## 2018-01-03 DIAGNOSIS — Z9221 Personal history of antineoplastic chemotherapy: Secondary | ICD-10-CM

## 2018-01-03 DIAGNOSIS — R1010 Upper abdominal pain, unspecified: Secondary | ICD-10-CM

## 2018-01-03 DIAGNOSIS — Z171 Estrogen receptor negative status [ER-]: Secondary | ICD-10-CM | POA: Insufficient documentation

## 2018-01-03 DIAGNOSIS — C50511 Malignant neoplasm of lower-outer quadrant of right female breast: Secondary | ICD-10-CM | POA: Diagnosis not present

## 2018-01-03 DIAGNOSIS — Z7982 Long term (current) use of aspirin: Secondary | ICD-10-CM

## 2018-01-03 DIAGNOSIS — Z79899 Other long term (current) drug therapy: Secondary | ICD-10-CM | POA: Insufficient documentation

## 2018-01-03 NOTE — Progress Notes (Signed)
Patient here for follow up. No changes since her last appointment. She had a clinical breast exam and Mammo in August.

## 2018-01-04 LAB — CA 27.29 (SERIAL MONITOR): CAN 27.29: 23.2 U/mL (ref 0.0–38.6)

## 2018-01-18 IMAGING — CR DG ABDOMEN 1V
2 series · 2 of 2 positions shown · non-contrast
Comparison: None.

CLINICAL DATA: Constipation and abdominal discomfort.

EXAM:
ABDOMEN - 1 VIEW

[abdomen kub (1 of 2)]
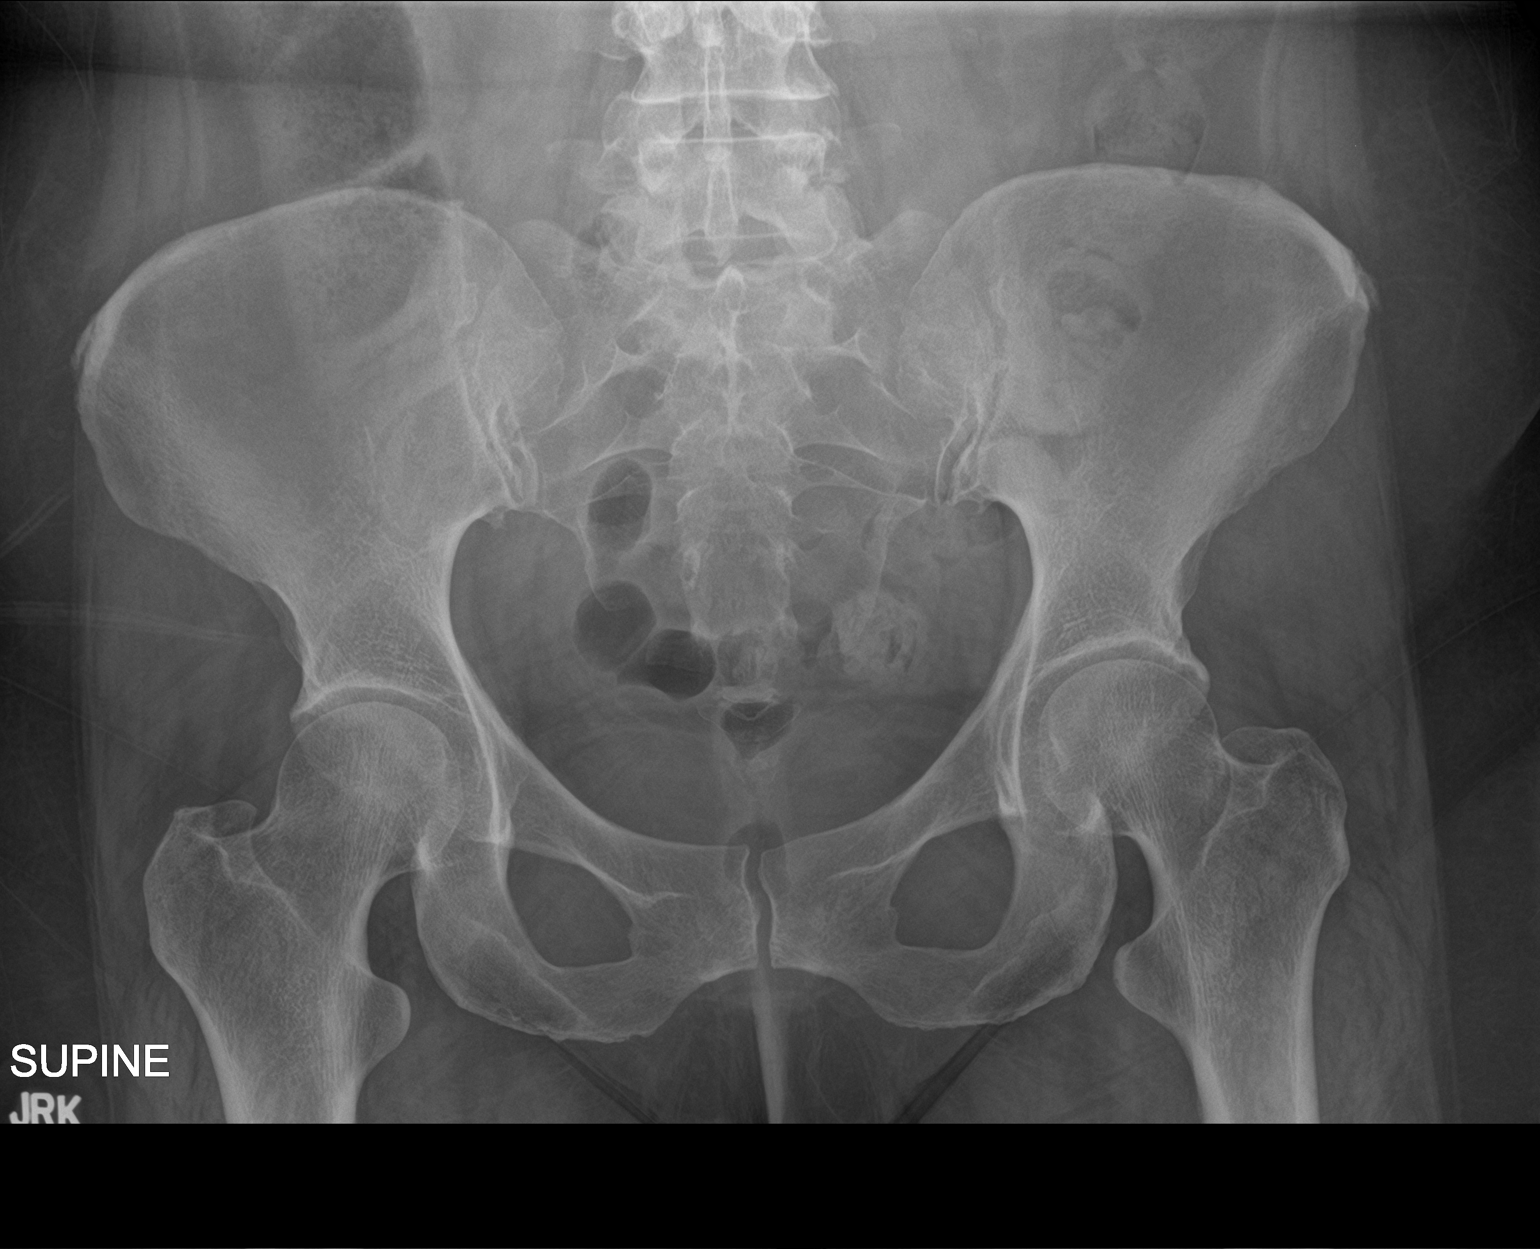

[abdomen kub (2 of 2)]
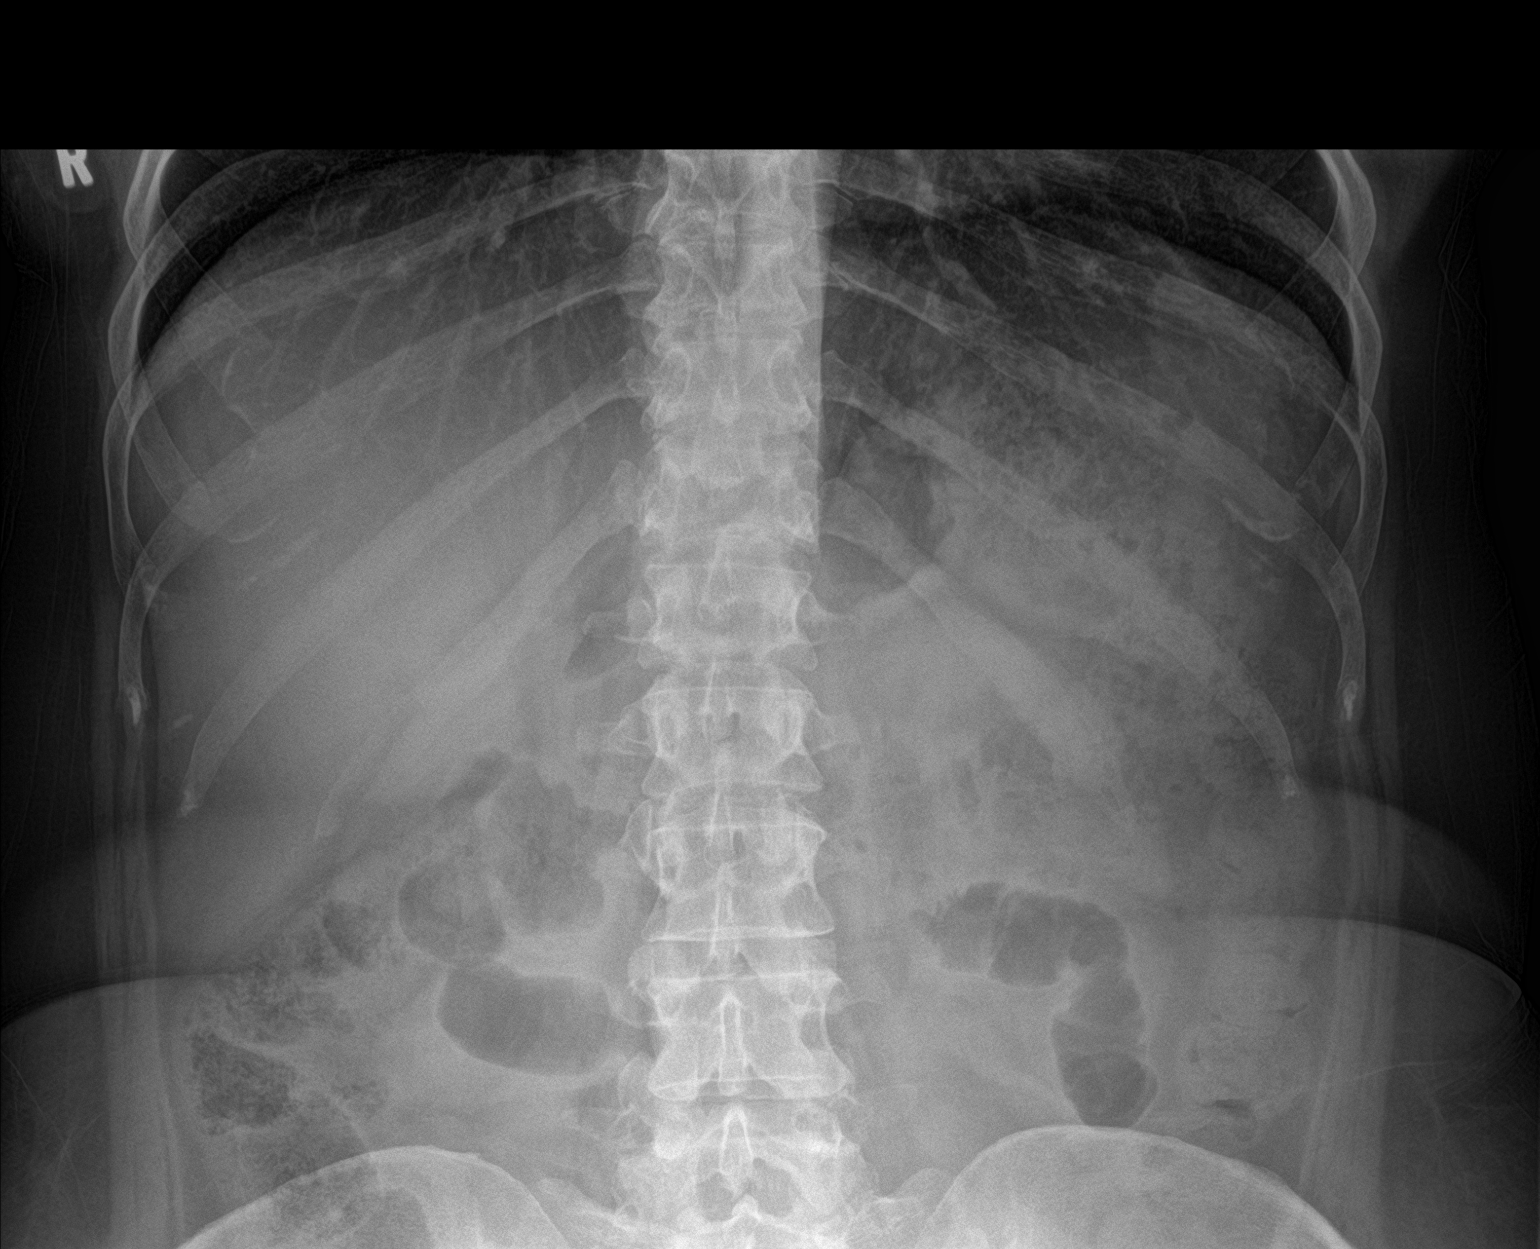

[2 of 2 positions shown; findings below may reference images not displayed]

FINDINGS: The bowel gas pattern is normal. No radio-opaque calculi or other
significant radiographic abnormality are seen. Generous colonic
stool volume.
IMPRESSION: Generous colonic stool volume without evidence of bowel obstruction
or perforation.

## 2018-03-02 DIAGNOSIS — G245 Blepharospasm: Secondary | ICD-10-CM | POA: Diagnosis not present

## 2018-06-06 DIAGNOSIS — G245 Blepharospasm: Secondary | ICD-10-CM | POA: Diagnosis not present

## 2018-07-02 ENCOUNTER — Encounter: Payer: Self-pay | Admitting: *Deleted

## 2018-07-05 ENCOUNTER — Ambulatory Visit: Payer: 59 | Admitting: Oncology

## 2018-07-05 ENCOUNTER — Other Ambulatory Visit: Payer: 59

## 2018-07-16 ENCOUNTER — Other Ambulatory Visit: Payer: 59

## 2018-07-16 ENCOUNTER — Ambulatory Visit: Payer: 59 | Admitting: Oncology

## 2018-07-16 ENCOUNTER — Ambulatory Visit: Payer: 59 | Admitting: Radiation Oncology

## 2018-08-17 ENCOUNTER — Encounter: Payer: Self-pay | Admitting: *Deleted

## 2018-09-16 NOTE — Progress Notes (Signed)
[place]  [phone] [phone]) [phone]  ID: Manfred Arch OB: 1965-09-06  MR#: [id number]  CBU#:[id number]  Patient Care Team: Marinda Elk, MD as PCP - General (Physician Assistant) Leonie Green, MD as Referring Physician (Surgery) Lloyd Huger, MD as Consulting Physician (Oncology) Rico Junker, RN as Oncology Nurse Navigator Noreene Filbert, MD as Referring Physician (Radiation Oncology)  CHIEF COMPLAINT: Pathologic stage Ia poorly differentiated triple negative carcinoma of the lower outer quadrant of the right breast.  INTERVAL HISTORY: Patient returns to clinic today for routine 65-month follow-up.  She continues to feel well and remains asymptomatic. She has no neurologic complaints. She denies any recent fevers or illnesses. She has a good appetite and denies weight loss.  She denies any chest pain, shortness of breath, cough, or hemoptysis.  She denies any nausea, vomiting, or diarrhea. She has no urinary complaints.  Patient feels at her baseline offers no specific complaints today.  REVIEW OF SYSTEMS:   Review of Systems  Constitutional: Negative.  Negative for fever, malaise/fatigue and weight loss.  Respiratory: Negative.  Negative for cough, hemoptysis and shortness of breath.   Cardiovascular: Negative.  Negative for chest pain and leg swelling.  Gastrointestinal: Negative.  Negative for abdominal pain, blood in stool, constipation, diarrhea and melena.  Genitourinary: Negative.  Negative for flank pain.  Musculoskeletal: Negative.  Negative for back pain.  Skin: Negative.  Negative for rash.  Neurological: Negative.  Negative for sensory change, focal weakness and weakness.  Psychiatric/Behavioral: Negative.  The patient is not nervous/anxious.     As per HPI. Otherwise, a complete review of systems is negative.  PAST MEDICAL HISTORY: Past Medical History:  Diagnosis Date  . Arthritis    in neck  .  Breast cancer ([place]) 2017   right breast/chemo and radiation  . Breast cancer, right breast ([place]) 11/18/2015  . GERD (gastroesophageal reflux disease)   . Kidney stones   . Last menstrual period (LMP) > 10 days ago 1995  . Personal history of chemotherapy 2017   Right breast  . Personal history of radiation therapy 2017   Right breast    PAST SURGICAL HISTORY: Past Surgical History:  Procedure Laterality Date  . ABDOMINAL HYSTERECTOMY  1995   partial  . BREAST BIOPSY Right 2017   Invasice mammory carcinoma  . BREAST EXCISIONAL BIOPSY Right 12/08/2015   lumpectomy  . COLONOSCOPY  2016  . PARTIAL MASTECTOMY WITH NEEDLE LOCALIZATION Right 12/08/2015   Procedure: PARTIAL MASTECTOMY WITH NEEDLE LOCALIZATION;  Surgeon: Leonie Green, MD;  Location: [facility] ORS;  Service: General;  Laterality: Right;  . SENTINEL NODE BIOPSY Right 12/08/2015   Procedure: SENTINEL NODE BIOPSY;  Surgeon: Leonie Green, MD;  Location: [facility] ORS;  Service: General;  Laterality: Right;    FAMILY HISTORY: Family History  Problem Relation Age of Onset  . Breast cancer Neg Hx     ADVANCED DIRECTIVES (Y/N):  N  HEALTH MAINTENANCE: Social History   Tobacco Use  . Smoking status: Never Smoker  . Smokeless tobacco: Never Used  Substance Use Topics  . Alcohol use: No  . Drug use: No     Colonoscopy:  PAP:  Bone density:  Lipid panel:  No Known Allergies  Current Outpatient Medications  Medication Sig Dispense Refill  . acetaminophen (TYLENOL) 325 MG tablet Take 2 tablets by mouth every 4 (four) hours as needed.    Marland Kitchen amitriptyline (ELAVIL) 10 MG tablet Take by mouth.    Marland Kitchen  aspirin EC 81 MG tablet Take 1 tablet by mouth daily.    . Biotin 1 MG CAPS Take by mouth.    . carisoprodol (SOMA) 350 MG tablet Take 1 tablet by mouth 4 (four) times daily as needed.    . cyclobenzaprine (FLEXERIL) 5 MG tablet   0  . fluticasone (FLONASE) 50 MCG/ACT nasal spray Place 1 spray into both nostrils daily.     . hyoscyamine (LEVSIN, ANASPAZ) 0.125 MG tablet   1  . ibuprofen (ADVIL,MOTRIN) 200 MG tablet Take 1 tablet by mouth as needed.    . lactulose (CHRONULAC) 10 GM/15ML solution Take 45 mLs (30 g total) by mouth 2 (two) times daily as needed for mild constipation or moderate constipation. 240 mL 0  . meloxicam (MOBIC) 7.5 MG tablet Take 1 tablet by mouth daily as needed.    . Multiple Vitamin (MULTI-VITAMINS) TABS Take 1 tablet by mouth daily.    Marland Kitchen omeprazole (PRILOSEC) 20 MG capsule Take 1 capsule by mouth daily.    . OnabotulinumtoxinA (BOTOX IJ) Inject 1 Dose as directed every 3 (three) months.    . ondansetron (ZOFRAN) 8 MG tablet Take 1 tablet (8 mg total) by mouth 2 (two) times daily as needed for refractory nausea / vomiting. 30 tablet 1  . prochlorperazine (COMPAZINE) 10 MG tablet Take 1 tablet (10 mg total) by mouth every 6 (six) hours as needed (Nausea or vomiting). 30 tablet 1   No current facility-administered medications for this visit.     OBJECTIVE: There were no vitals filed for this visit.   There is no height or weight on file to calculate BMI.    ECOG FS:0 - Asymptomatic  General: Well-developed, well-nourished, no acute distress. Eyes: Pink conjunctiva, anicteric sclera. HEENT: Normocephalic, moist mucous membranes. Breast: Patient reports normal breast exam by radiation oncology this morning. Lungs: Clear to auscultation bilaterally. Heart: Regular rate and rhythm. No rubs, murmurs, or gallops. Abdomen: Soft, nontender, nondistended. No organomegaly noted, normoactive bowel sounds. Musculoskeletal: No edema, cyanosis, or clubbing. Neuro: Alert, answering all questions appropriately. Cranial nerves grossly intact. Skin: No rashes or petechiae noted. Psych: Normal affect.  LAB RESULTS:  Lab Results  Component Value Date   NA 137 06/28/2017   K 4.3 06/28/2017   CL 103 06/28/2017   CO2 26 06/28/2017   GLUCOSE 104 (H) 06/28/2017   BUN 22 (H) 06/28/2017   CREATININE  0.56 06/28/2017   CALCIUM 9.2 06/28/2017   PROT 7.4 06/28/2017   ALBUMIN 4.2 06/28/2017   AST 19 06/28/2017   ALT 19 06/28/2017   ALKPHOS 60 06/28/2017   BILITOT 0.5 06/28/2017   GFRNONAA >60 06/28/2017   GFRAA >60 06/28/2017    Lab Results  Component Value Date   WBC 6.8 06/28/2017   NEUTROABS 4.8 06/28/2017   HGB 13.0 06/28/2017   HCT 38.5 06/28/2017   MCV 88.3 06/28/2017   PLT 301 06/28/2017     STUDIES: No results found.  ASSESSMENT: Pathologic stage Ia poorly differentiated triple negative carcinoma of the lower outer quadrant of the right breast.  PLAN:    1. Pathologic stage Ia poorly differentiated triple negative carcinoma of the lower outer quadrant of the right breast:  Final pathology confirmed breast primary.  Patient completed 4 cycles adjuvant chemotherapy with Taxotere and Cytoxan on March 17, 2016. She also completed adjuvant XRT.  Previously, CT of the chest, abdomen, and pelvis did not reveal any metastatic disease.  She did not require an aromatase inhibitor given the  triple negative status of her disease.  Her most recent mammogram on November 01, 2017 was reported as BI-RADS 2.  Repeat in August 2020.  Her CA 27-29 continues to be within normal limits at 23.9.  Return to clinic in 6 months for routine evaluation.   2. Genetic testing: Given patient's age and triple negative status of her tumor, will consider genetic testing in the future. She does not have any children. She has one brother and he does not have any children. No family history.   I spent a total of 20 minutes face-to-face with the patient of which greater than 50% of the visit was spent in counseling and coordination of care as detailed above.    Patient expressed understanding and was in agreement with this plan. She also understands that She can call clinic at any time with any questions, concerns, or complaints.   Cancer Staging Breast cancer of lower-outer quadrant of right female  breast [facility]) Staging form: Breast, AJCC 7th Edition - Clinical stage from 12/20/2015: Stage IA (T1b, N0, M0) - Signed by Lloyd Huger, MD on 12/20/2015    Lloyd Huger, MD 09/18/18 1:15 PM

## 2018-09-17 ENCOUNTER — Ambulatory Visit: Payer: 59 | Admitting: Radiation Oncology

## 2018-09-17 ENCOUNTER — Ambulatory Visit
Admission: RE | Admit: 2018-09-17 | Discharge: 2018-09-17 | Disposition: A | Discharge status: Home / Self Care | Payer: 59 | Source: Ambulatory Visit | Attending: Radiation Oncology | Admitting: Radiation Oncology

## 2018-09-17 ENCOUNTER — Inpatient Hospital Stay: Payer: 59 | Attending: Oncology

## 2018-09-17 ENCOUNTER — Encounter: Payer: Self-pay | Admitting: Radiation Oncology

## 2018-09-17 ENCOUNTER — Other Ambulatory Visit: Payer: Self-pay

## 2018-09-17 ENCOUNTER — Inpatient Hospital Stay (HOSPITAL_BASED_OUTPATIENT_CLINIC_OR_DEPARTMENT_OTHER): Payer: 59 | Admitting: Oncology

## 2018-09-17 VITALS — BP 138/91 | HR 76 | Temp 96.7°F | Resp 18 | Wt 184.6 lb

## 2018-09-17 DIAGNOSIS — Z171 Estrogen receptor negative status [ER-]: Secondary | ICD-10-CM | POA: Diagnosis not present

## 2018-09-17 DIAGNOSIS — Z9221 Personal history of antineoplastic chemotherapy: Secondary | ICD-10-CM | POA: Insufficient documentation

## 2018-09-17 DIAGNOSIS — Z853 Personal history of malignant neoplasm of breast: Secondary | ICD-10-CM | POA: Diagnosis present

## 2018-09-17 DIAGNOSIS — Z87442 Personal history of urinary calculi: Secondary | ICD-10-CM | POA: Diagnosis not present

## 2018-09-17 DIAGNOSIS — C50511 Malignant neoplasm of lower-outer quadrant of right female breast: Secondary | ICD-10-CM | POA: Diagnosis not present

## 2018-09-17 DIAGNOSIS — Z923 Personal history of irradiation: Secondary | ICD-10-CM | POA: Diagnosis not present

## 2018-09-17 DIAGNOSIS — M1990 Unspecified osteoarthritis, unspecified site: Secondary | ICD-10-CM

## 2018-09-17 DIAGNOSIS — Z79899 Other long term (current) drug therapy: Secondary | ICD-10-CM | POA: Insufficient documentation

## 2018-09-17 NOTE — Progress Notes (Signed)
Radiation Oncology Follow up Note  Name: Melanie Campos   Date:   09/17/2018 MRN:  [id number] DOB: [birth year]    This 53 y.o. female presents to the clinic today for 2-year follow-up status post whole breast radiation to her right breast for triple triple negative stage I invasive mammary carcinoma.  REFERRING PROVIDER: Marinda Elk, MD  HPI: Patient is a 53 year old female now out 2 years having completed whole breast radiation to her right breast for triple negative stage I invasive mammary carcinoma.  Seen today in routine follow-up she is doing well.  She specifically denies breast tenderness cough or bone pain.  Her most recent mammogram which I have reviewed.  Was last August BI-RADS 2 benign  COMPLICATIONS OF TREATMENT: none  FOLLOW UP COMPLIANCE: keeps appointments   PHYSICAL EXAM:  BP (!) 138/91 (BP Location: Left Arm, Patient Position: Sitting)   Pulse 76   Temp (!) 96.7 F (35.9 C) (Tympanic)   Resp 18   Wt 184 lb 10.2 oz (83.7 kg)   BMI 30.72 kg/m  Lungs are clear to A&P cardiac examination essentially unremarkable with regular rate and rhythm. No dominant mass or nodularity is noted in either breast in 2 positions examined. Incision is well-healed. No axillary or supraclavicular adenopathy is appreciated. Cosmetic result is excellent.  Well-developed well-nourished patient in NAD. HEENT reveals PERLA, EOMI, discs not visualized.  Oral cavity is clear. No oral mucosal lesions are identified. Neck is clear without evidence of cervical or supraclavicular adenopathy. Lungs are clear to A&P. Cardiac examination is essentially unremarkable with regular rate and rhythm without murmur rub or thrill. Abdomen is benign with no organomegaly or masses noted. Motor sensory and DTR levels are equal and symmetric in the upper and lower extremities. Cranial nerves II through XII are grossly intact. Proprioception is intact. No peripheral adenopathy or edema is identified. No motor or  sensory levels are noted. Crude visual fields are within normal range.  RADIOLOGY RESULTS: Mammograms reviewed compatible with above-stated findings  PLAN: At the present time patient is doing well with no evidence of disease 2 years out.  I am pleased with her overall progress.  She is not on antiestrogen therapy based on her triple negative status of her tumor.  I have asked to see her back in 1 year for follow-up.  She will have mammograms in the next several months which I will review when they become available.  Patient knows to call with any concerns at any time.  I would like to take this opportunity to thank you for allowing me to participate in the care of your patient.Noreene Filbert, MD

## 2018-09-17 NOTE — Progress Notes (Signed)
Patient denies any concerns today.  

## 2018-09-18 LAB — CANCER ANTIGEN 27.29: CA 27.29: 23.9 U/mL (ref 0.0–38.6)

## 2018-11-05 ENCOUNTER — Ambulatory Visit
Admission: RE | Admit: 2018-11-05 | Discharge: 2018-11-05 | Disposition: A | Discharge status: Home / Self Care | Payer: 59 | Source: Ambulatory Visit | Attending: Oncology | Admitting: Oncology

## 2018-11-05 DIAGNOSIS — C50511 Malignant neoplasm of lower-outer quadrant of right female breast: Secondary | ICD-10-CM

## 2018-11-05 DIAGNOSIS — Z171 Estrogen receptor negative status [ER-]: Secondary | ICD-10-CM | POA: Diagnosis present

## 2019-03-03 IMAGING — US US ABDOMEN COMPLETE
1 series · 14 of 25 positions shown · non-contrast
Comparison: CT 01/12/2017

CLINICAL DATA: Generalized abdominal pain

EXAM:
ABDOMEN ULTRASOUND COMPLETE

[Series 1: us abdomen complete · 0.22mm/px · 14 of 88 slices shown]
[im 1/88]
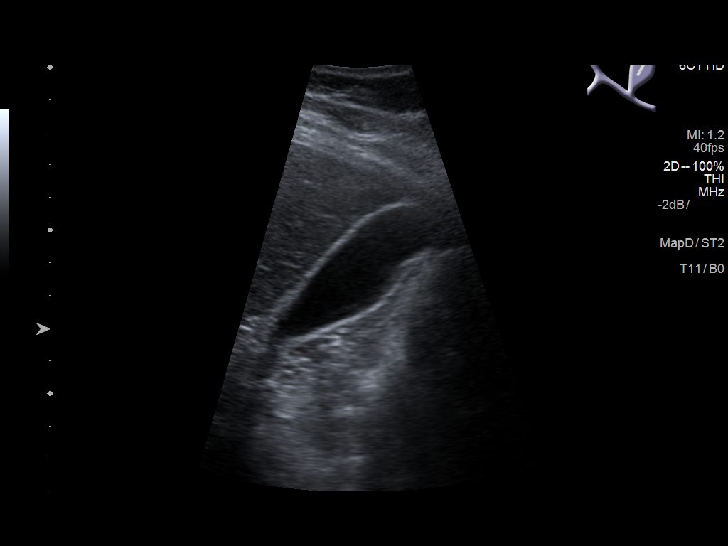
[im 8/88]
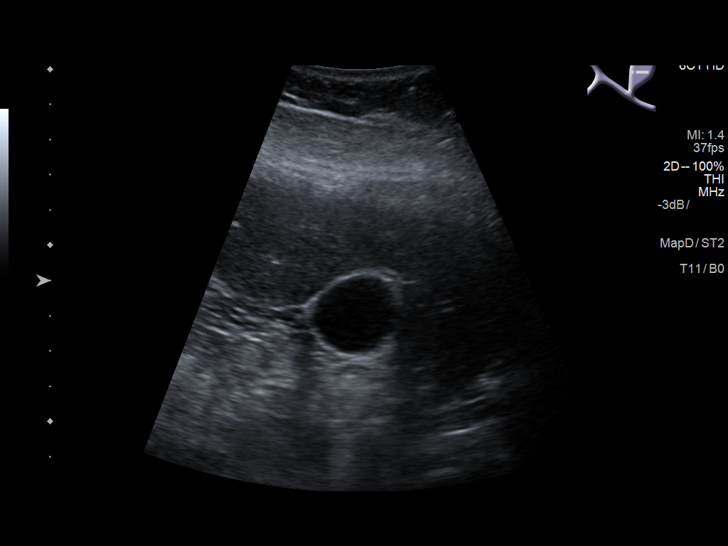
[im 15/88]
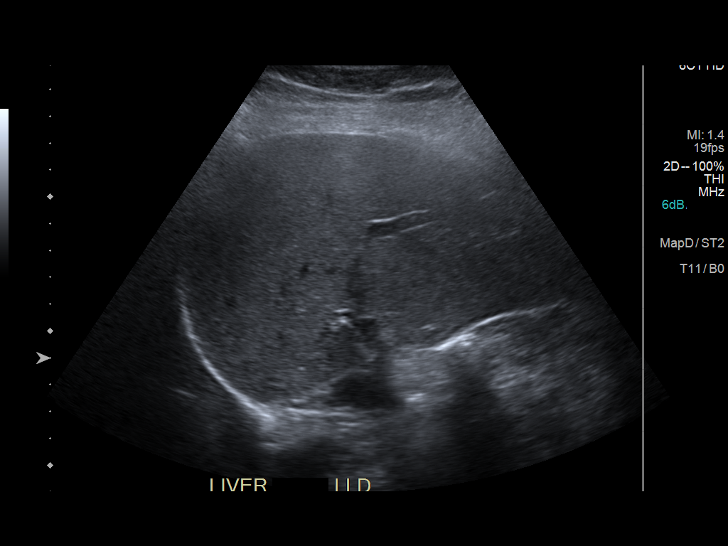
[im 22/88]
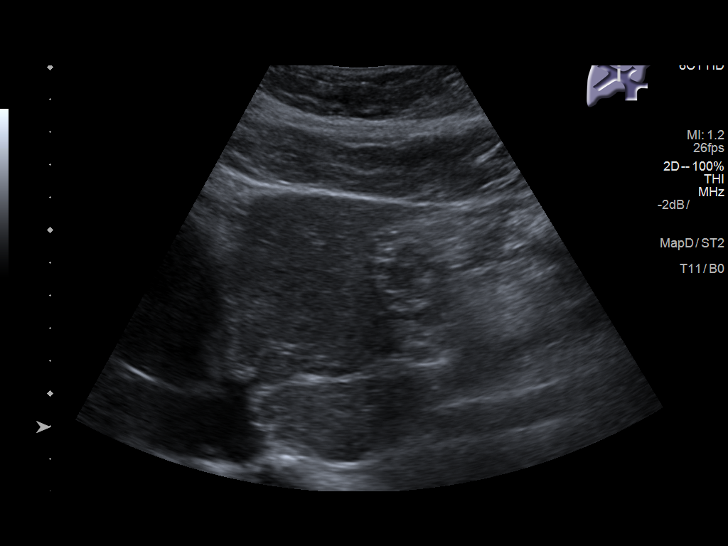
[im 30/88]
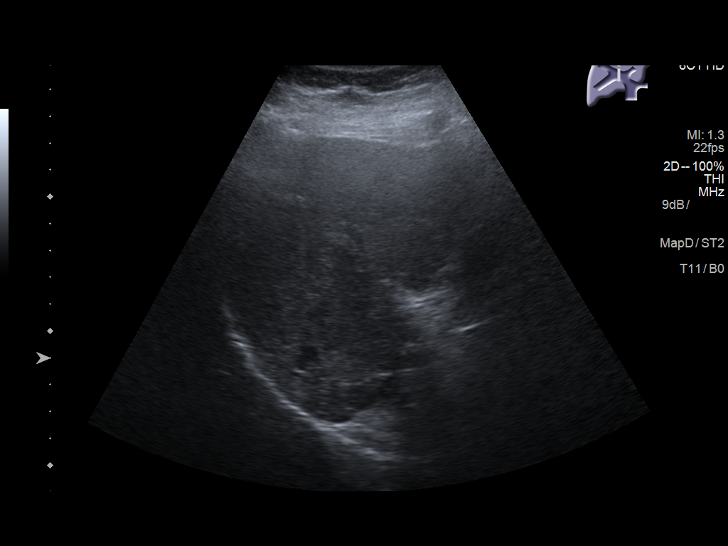
[im 33/88]
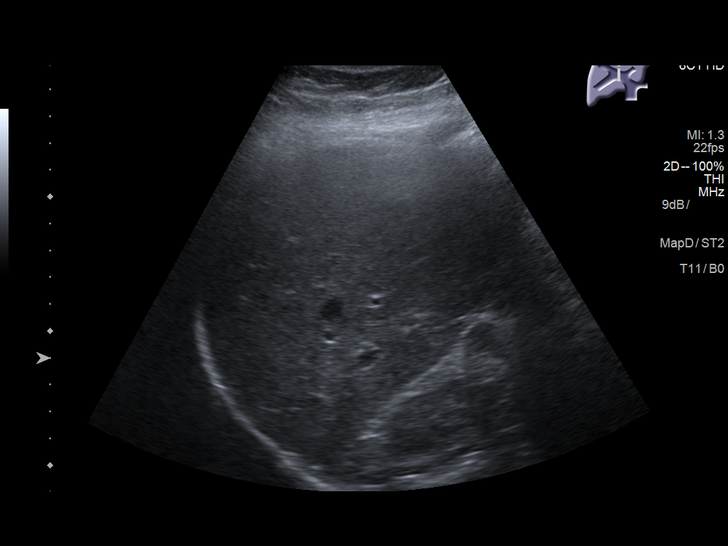
[im 40/88]
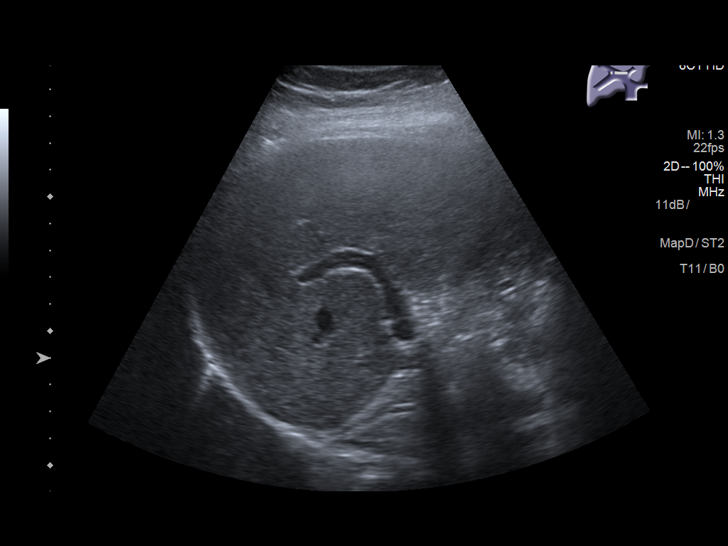
[im 48/88]
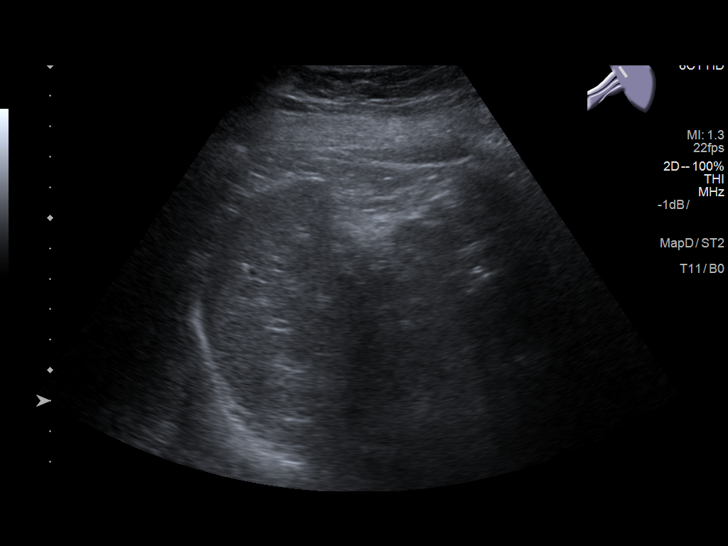
[im 55/88]
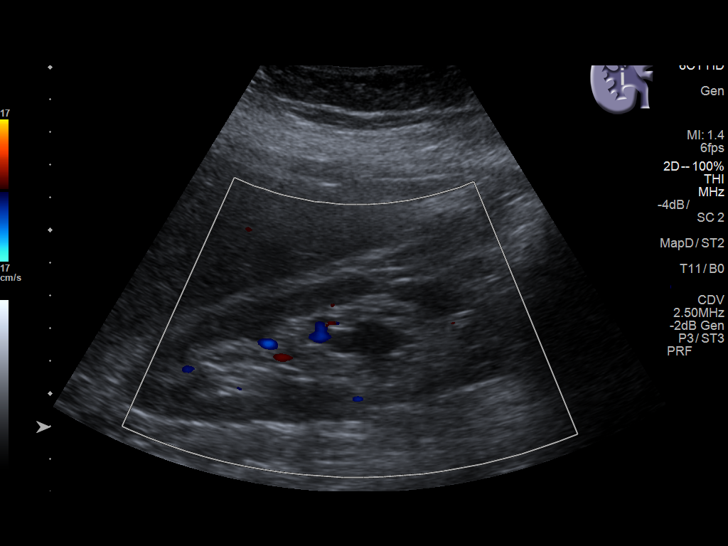
[im 59/88]
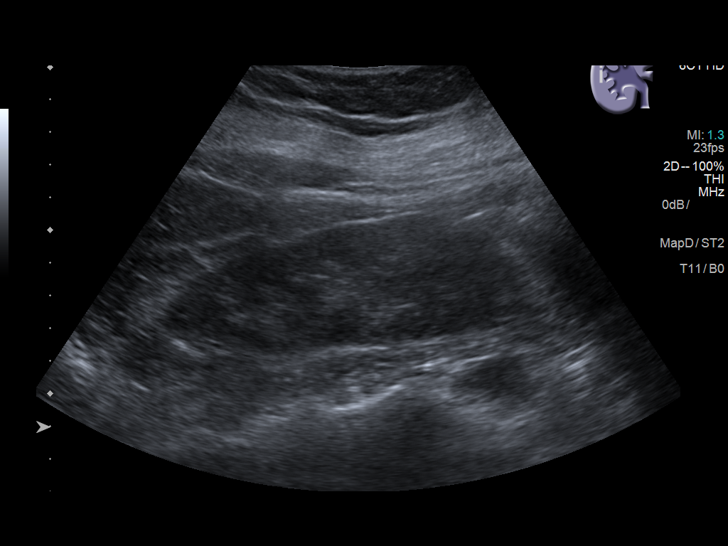
[im 66/88]
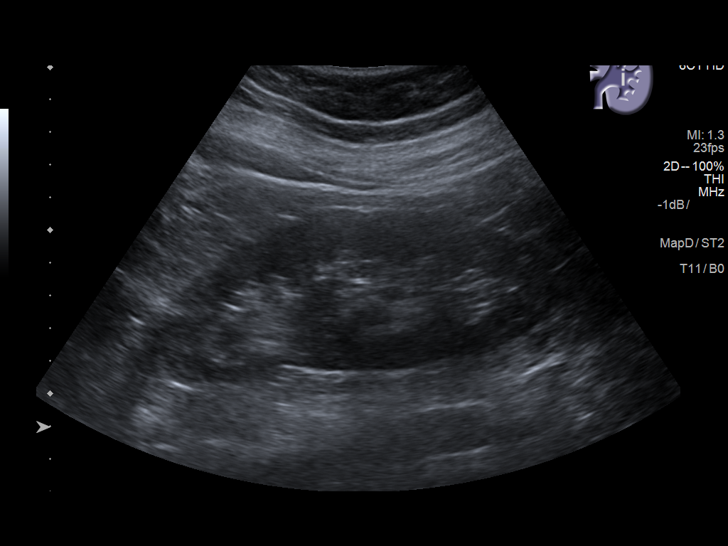
[im 73/88]
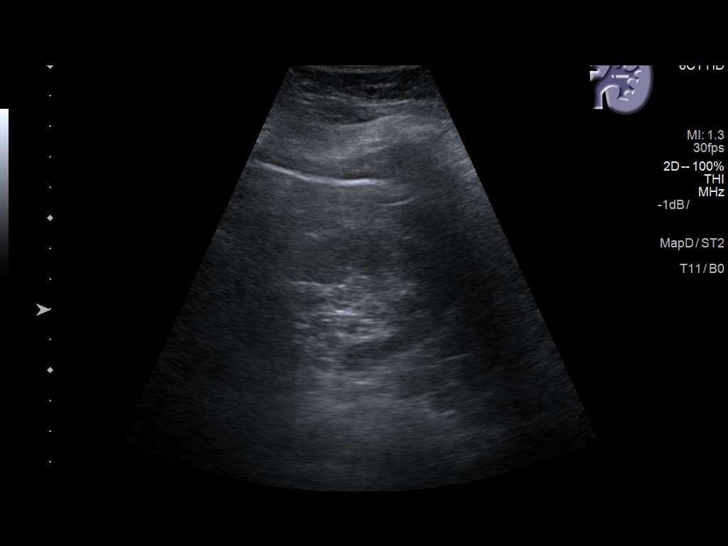
[im 80/88]
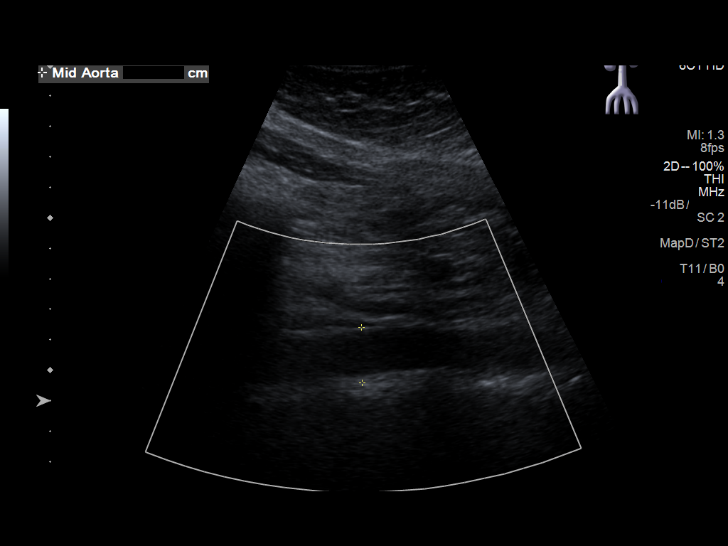
[im 88/88]
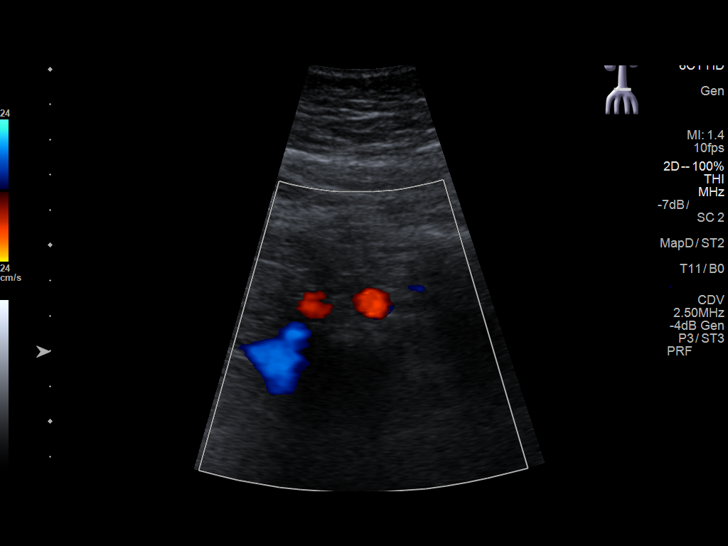

[14 of 25 positions shown; findings below may reference images not displayed]

FINDINGS: Gallbladder: No gallstones or wall thickening visualized. No
sonographic Murphy sign noted by sonographer.

Common bile duct: Diameter: Normal caliber, 3 mm

Liver: No focal lesion identified. Within normal limits in
parenchymal echogenicity. Portal vein is patent on color Doppler
imaging with normal direction of blood flow towards the liver.

IVC: No abnormality visualized.

Pancreas: Visualized portion unremarkable.

Spleen: Size and appearance within normal limits.

Right Kidney: Length: 12.1 cm. Echogenicity within normal limits. No
mass or hydronephrosis visualized.

Left Kidney: Length: 12.9 cm. Echogenicity within normal limits. No
mass or hydronephrosis visualized.

Abdominal aorta: No aneurysm visualized.

Other findings: None.
IMPRESSION: Unremarkable abdominal ultrasound.

## 2019-03-16 NOTE — Progress Notes (Signed)
Melanie Campos  Telephone:([phone]) [phone] Fax:(336) [phone]  ID: Manfred Arch OB: Jun 06, 1965  MR#: [id number]  [id number]  Patient Care Team: Marinda Elk, MD as PCP - General (Physician Assistant) Leonie Green, MD as Referring Physician (Surgery) Lloyd Huger, MD as Consulting Physician (Oncology) Rico Junker, RN as Oncology Nurse Navigator Noreene Filbert, MD as Referring Physician (Radiation Oncology)  CHIEF COMPLAINT: Pathologic stage Ia poorly differentiated triple negative carcinoma of the lower outer quadrant of the right breast.  INTERVAL HISTORY: Patient returns to clinic today for routine 51-month evaluation. She currently feels well and remains asymptomatic. She has intermittent tenderness at the site of her surgical scar, but otherwise feels well. She has no neurologic complaints. She denies any recent fevers or illnesses. She has a good appetite and denies weight loss.  She denies any chest pain, shortness of breath, cough, or hemoptysis.  She denies any nausea, vomiting, or diarrhea. She has no urinary complaints. Patient offers no further specific complaints today.  REVIEW OF SYSTEMS:   Review of Systems  Constitutional: Negative.  Negative for fever, malaise/fatigue and weight loss.  Respiratory: Negative.  Negative for cough, hemoptysis and shortness of breath.   Cardiovascular: Negative.  Negative for chest pain and leg swelling.  Gastrointestinal: Negative.  Negative for abdominal pain, blood in stool, constipation, diarrhea and melena.  Genitourinary: Negative.  Negative for flank pain.  Musculoskeletal: Negative.  Negative for back pain.  Skin: Negative.  Negative for rash.  Neurological: Negative.  Negative for sensory change, focal weakness and weakness.  Psychiatric/Behavioral: Negative.  The patient is not nervous/anxious.     As per HPI. Otherwise, a complete review of systems is negative.  PAST MEDICAL  HISTORY: Past Medical History:  Diagnosis Date  . Arthritis    in neck  . Breast cancer ([place]) 2017   right breast/chemo and radiation  . Breast cancer, right breast ([place]) 11/18/2015  . GERD (gastroesophageal reflux disease)   . Kidney stones   . Last menstrual period (LMP) > 10 days ago 1995  . Personal history of chemotherapy 2017   Right breast  . Personal history of radiation therapy 2017   Right breast    PAST SURGICAL HISTORY: Past Surgical History:  Procedure Laterality Date  . ABDOMINAL HYSTERECTOMY  1995   partial  . BREAST BIOPSY Right 2017   Invasice mammory carcinoma  . BREAST EXCISIONAL BIOPSY Right 12/08/2015   lumpectomy  . COLONOSCOPY  2016  . PARTIAL MASTECTOMY WITH NEEDLE LOCALIZATION Right 12/08/2015   Procedure: PARTIAL MASTECTOMY WITH NEEDLE LOCALIZATION;  Surgeon: Leonie Green, MD;  Location: [facility] ORS;  Service: General;  Laterality: Right;  . SENTINEL NODE BIOPSY Right 12/08/2015   Procedure: SENTINEL NODE BIOPSY;  Surgeon: Leonie Green, MD;  Location: [facility] ORS;  Service: General;  Laterality: Right;    FAMILY HISTORY: Family History  Problem Relation Age of Onset  . Breast cancer Neg Hx     ADVANCED DIRECTIVES (Y/N):  N  HEALTH MAINTENANCE: Social History   Tobacco Use  . Smoking status: Never Smoker  . Smokeless tobacco: Never Used  Substance Use Topics  . Alcohol use: No  . Drug use: No     Colonoscopy:  PAP:  Bone density:  Lipid panel:  No Known Allergies  Current Outpatient Medications  Medication Sig Dispense Refill  . acetaminophen (TYLENOL) 325 MG tablet Take 2 tablets by mouth every 4 (four) hours as needed.    Marland Kitchen amitriptyline (ELAVIL)  10 MG tablet Take by mouth.    Marland Kitchen aspirin EC 81 MG tablet Take 1 tablet by mouth daily.    . Biotin 1 MG CAPS Take by mouth.    . carisoprodol (SOMA) 350 MG tablet Take 1 tablet by mouth 4 (four) times daily as needed.    . cyclobenzaprine (FLEXERIL) 5 MG tablet   0  .  fluticasone (FLONASE) 50 MCG/ACT nasal spray Place 1 spray into both nostrils daily.    . hyoscyamine (LEVSIN, ANASPAZ) 0.125 MG tablet   1  . ibuprofen (ADVIL,MOTRIN) 200 MG tablet Take 1 tablet by mouth as needed.    . lactulose (CHRONULAC) 10 GM/15ML solution Take 45 mLs (30 g total) by mouth 2 (two) times daily as needed for mild constipation or moderate constipation. 240 mL 0  . meloxicam (MOBIC) 7.5 MG tablet Take 1 tablet by mouth daily as needed.    . Multiple Vitamin (MULTI-VITAMINS) TABS Take 1 tablet by mouth daily.    Marland Kitchen omeprazole (PRILOSEC) 20 MG capsule Take 1 capsule by mouth daily.    . OnabotulinumtoxinA (BOTOX IJ) Inject 1 Dose as directed every 3 (three) months.    . ondansetron (ZOFRAN) 8 MG tablet Take 1 tablet (8 mg total) by mouth 2 (two) times daily as needed for refractory nausea / vomiting. 30 tablet 1  . prochlorperazine (COMPAZINE) 10 MG tablet Take 1 tablet (10 mg total) by mouth every 6 (six) hours as needed (Nausea or vomiting). 30 tablet 1   No current facility-administered medications for this visit.    OBJECTIVE: Vitals:   03/21/19 1422  BP: (!) 143/91  Resp: 18  Temp: 98.6 F (37 C)  SpO2: 100%     Body mass index is 31.28 kg/m.    ECOG FS:0 - Asymptomatic  General: Well-developed, well-nourished, no acute distress. Eyes: Pink conjunctiva, anicteric sclera. HEENT: Normocephalic, moist mucous membranes. Breasts: Exam deferred today. Lungs: No audible wheezing or coughing. Heart: Regular rate and rhythm. Abdomen: Soft, nontender, no obvious distention. Musculoskeletal: No edema, cyanosis, or clubbing. Neuro: Alert, answering all questions appropriately. Cranial nerves grossly intact. Skin: No rashes or petechiae noted. Psych: Normal affect.   LAB RESULTS:  Lab Results  Component Value Date   NA 137 06/28/2017   K 4.3 06/28/2017   CL 103 06/28/2017   CO2 26 06/28/2017   GLUCOSE 104 (H) 06/28/2017   BUN 22 (H) 06/28/2017   CREATININE 0.56  06/28/2017   CALCIUM 9.2 06/28/2017   PROT 7.4 06/28/2017   ALBUMIN 4.2 06/28/2017   AST 19 06/28/2017   ALT 19 06/28/2017   ALKPHOS 60 06/28/2017   BILITOT 0.5 06/28/2017   GFRNONAA >60 06/28/2017   GFRAA >60 06/28/2017    Lab Results  Component Value Date   WBC 6.8 06/28/2017   NEUTROABS 4.8 06/28/2017   HGB 13.0 06/28/2017   HCT 38.5 06/28/2017   MCV 88.3 06/28/2017   PLT 301 06/28/2017     STUDIES: No results found.  ASSESSMENT: Pathologic stage Ia poorly differentiated triple negative carcinoma of the lower outer quadrant of the right breast.  PLAN:    1. Pathologic stage Ia poorly differentiated triple negative carcinoma of the lower outer quadrant of the right breast:  Final pathology confirmed breast primary.  Patient completed 4 cycles adjuvant chemotherapy with Taxotere and Cytoxan on March 17, 2016. She also completed adjuvant XRT.  Previously, CT of the chest, abdomen, and pelvis did not reveal any metastatic disease.  She did not require  an aromatase inhibitor given the triple negative status of her disease.  Her most recent mammogram on November 05, 2018 was reported as BI-RADS 2.  Repeat in August 2021.  CA 27-29 continues to be within normal limits.  Today's result is pending.  Return to clinic in 6 months for routine evaluation.   2. Genetic testing: Given patient's age and triple negative status of her tumor have offered genetic testing, but this has not been completed. She does not have any children. She has one brother and he does not have any children. No family history.   I spent a total of 15 minutes face-to-face with the patient and reviewing chart data of which greater than 50% of the visit was spent in counseling and coordination of care as detailed above.    Patient expressed understanding and was in agreement with this plan. She also understands that She can call clinic at any time with any questions, concerns, or complaints.   Cancer Staging Breast  cancer of lower-outer quadrant of right female breast [facility]) Staging form: Breast, AJCC 7th Edition - Clinical stage from 12/20/2015: Stage IA (T1b, N0, M0) - Signed by Lloyd Huger, MD on 12/20/2015    Lloyd Huger, MD 03/21/19 3:04 PM

## 2019-03-20 ENCOUNTER — Other Ambulatory Visit: Payer: Self-pay

## 2019-03-20 DIAGNOSIS — C50511 Malignant neoplasm of lower-outer quadrant of right female breast: Secondary | ICD-10-CM

## 2019-03-21 ENCOUNTER — Inpatient Hospital Stay: Payer: 59 | Admitting: Oncology

## 2019-03-21 ENCOUNTER — Inpatient Hospital Stay: Payer: 59 | Attending: Oncology

## 2019-03-21 ENCOUNTER — Encounter: Payer: Self-pay | Admitting: Oncology

## 2019-03-21 ENCOUNTER — Other Ambulatory Visit: Payer: Self-pay

## 2019-03-21 VITALS — BP 143/91 | Temp 98.6°F | Resp 18 | Wt 188.0 lb

## 2019-03-21 DIAGNOSIS — Z791 Long term (current) use of non-steroidal anti-inflammatories (NSAID): Secondary | ICD-10-CM | POA: Insufficient documentation

## 2019-03-21 DIAGNOSIS — Z7982 Long term (current) use of aspirin: Secondary | ICD-10-CM | POA: Insufficient documentation

## 2019-03-21 DIAGNOSIS — Z171 Estrogen receptor negative status [ER-]: Secondary | ICD-10-CM

## 2019-03-21 DIAGNOSIS — K219 Gastro-esophageal reflux disease without esophagitis: Secondary | ICD-10-CM | POA: Diagnosis not present

## 2019-03-21 DIAGNOSIS — C50511 Malignant neoplasm of lower-outer quadrant of right female breast: Secondary | ICD-10-CM

## 2019-03-21 DIAGNOSIS — Z79899 Other long term (current) drug therapy: Secondary | ICD-10-CM | POA: Insufficient documentation

## 2019-03-22 LAB — CANCER ANTIGEN 27.29: CA 27.29: 29.8 U/mL (ref 0.0–38.6)

## 2019-03-28 ENCOUNTER — Encounter: Payer: Self-pay | Admitting: *Deleted

## 2019-08-26 ENCOUNTER — Encounter: Payer: Self-pay | Admitting: Oncology

## 2019-08-26 ENCOUNTER — Telehealth: Payer: Self-pay | Admitting: Oncology

## 2019-08-26 NOTE — Telephone Encounter (Signed)
Rescheduled appt due to provider out of office. Left appt details and mailed new appt sheet with updated appts for Dr. Grayland Ormond and Dr. Baruch Gouty.

## 2019-09-19 ENCOUNTER — Ambulatory Visit: Payer: 59 | Admitting: Radiation Oncology

## 2019-09-19 ENCOUNTER — Other Ambulatory Visit: Payer: 59

## 2019-09-19 ENCOUNTER — Ambulatory Visit: Payer: 59 | Admitting: Oncology

## 2019-09-23 ENCOUNTER — Ambulatory Visit: Payer: 59 | Admitting: Radiation Oncology

## 2019-10-06 NOTE — Progress Notes (Signed)
Melanie Campos  [phone] [phone]) [phone]  ID: Manfred Arch OB: 11-23-1965  MR#: [id number]  OJJ#:[id number]  Patient Care Team: Marinda Elk, MD as PCP - General (Physician Assistant) Leonie Green, MD as Referring Physician (Surgery) Lloyd Huger, MD as Consulting Physician (Oncology) Rico Junker, RN as Oncology Nurse Navigator Noreene Filbert, MD as Referring Physician (Radiation Oncology)  CHIEF COMPLAINT: Pathologic stage Ia poorly differentiated triple negative carcinoma of the lower outer quadrant of the right breast.  INTERVAL HISTORY: Patient returns to clinic today for routine 67-month evaluation.  She continues to feel well and remains asymptomatic.  She continues to have mild tenderness at her surgical site. She has no neurologic complaints. She denies any recent fevers or illnesses. She has a good appetite and denies weight loss.  She denies any chest pain, shortness of breath, cough, or hemoptysis.  She denies any nausea, vomiting, constipation, or diarrhea. She has no urinary complaints.  Patient offers no further specific complaints today.  REVIEW OF SYSTEMS:   Review of Systems  Constitutional: Negative.  Negative for fever, malaise/fatigue and weight loss.  Respiratory: Negative.  Negative for cough, hemoptysis and shortness of breath.   Cardiovascular: Negative.  Negative for chest pain and leg swelling.  Gastrointestinal: Negative.  Negative for abdominal pain, blood in stool, constipation, diarrhea and melena.  Genitourinary: Negative.  Negative for flank pain.  Musculoskeletal: Negative.  Negative for back pain.  Skin: Negative.  Negative for rash.  Neurological: Negative.  Negative for sensory change, focal weakness and weakness.  Psychiatric/Behavioral: Negative.  The patient is not nervous/anxious.     As per HPI. Otherwise, a complete review of systems is negative.  PAST MEDICAL HISTORY: Past  Medical History:  Diagnosis Date   Arthritis    in neck   Breast cancer ([place]) 2017   right breast/chemo and radiation   Breast cancer, right breast ([place]) 11/18/2015   GERD (gastroesophageal reflux disease)    Kidney stones    Last menstrual period (LMP) > 10 days ago [place] history of chemotherapy 2017   Right breast   Personal history of radiation therapy 2017   Right breast    PAST SURGICAL HISTORY: Past Surgical History:  Procedure Laterality Date   ABDOMINAL HYSTERECTOMY  1995   partial   BREAST BIOPSY Right 2017   Invasice mammory carcinoma   BREAST EXCISIONAL BIOPSY Right 12/08/2015   lumpectomy   COLONOSCOPY  2016   PARTIAL MASTECTOMY WITH NEEDLE LOCALIZATION Right 12/08/2015   Procedure: PARTIAL MASTECTOMY WITH NEEDLE LOCALIZATION;  Surgeon: Leonie Green, MD;  Location: [facility] ORS;  Service: General;  Laterality: Right;   SENTINEL NODE BIOPSY Right 12/08/2015   Procedure: SENTINEL NODE BIOPSY;  Surgeon: Leonie Green, MD;  Location: [facility] ORS;  Service: General;  Laterality: Right;    FAMILY HISTORY: Family History  Problem Relation Age of Onset   Breast cancer Neg Hx     ADVANCED DIRECTIVES (Y/N):  N  HEALTH MAINTENANCE: Social History   Tobacco Use   Smoking status: Never Smoker   Smokeless tobacco: Never Used  Substance Use Topics   Alcohol use: No   Drug use: No     Colonoscopy:  PAP:  Bone density:  Lipid panel:  No Known Allergies  Current Outpatient Medications  Medication Sig Dispense Refill   amitriptyline (ELAVIL) 10 MG tablet Take by mouth.     Biotin 1 MG CAPS Take by mouth.  cyclobenzaprine (FLEXERIL) 5 MG tablet   0   fluticasone (FLONASE) 50 MCG/ACT nasal spray Place 1 spray into both nostrils daily.     acetaminophen (TYLENOL) 325 MG tablet Take 2 tablets by mouth every 4 (four) hours as needed. (Patient not taking: Reported on 10/11/2019)     aspirin EC 81 MG tablet Take 1 tablet by  mouth daily. (Patient not taking: Reported on 10/11/2019)     carisoprodol (SOMA) 350 MG tablet Take 1 tablet by mouth 4 (four) times daily as needed. (Patient not taking: Reported on 10/11/2019)     hyoscyamine (LEVSIN, ANASPAZ) 0.125 MG tablet  (Patient not taking: Reported on 10/11/2019)  1   ibuprofen (ADVIL,MOTRIN) 200 MG tablet Take 1 tablet by mouth as needed. (Patient not taking: Reported on 10/11/2019)     lactulose (CHRONULAC) 10 GM/15ML solution Take 45 mLs (30 g total) by mouth 2 (two) times daily as needed for mild constipation or moderate constipation. (Patient not taking: Reported on 10/11/2019) 240 mL 0   meloxicam (MOBIC) 7.5 MG tablet Take 1 tablet by mouth daily as needed. (Patient not taking: Reported on 10/11/2019)     Multiple Vitamin (MULTI-VITAMINS) TABS Take 1 tablet by mouth daily. (Patient not taking: Reported on 10/11/2019)     omeprazole (PRILOSEC) 20 MG capsule Take 1 capsule by mouth daily. (Patient not taking: Reported on 10/11/2019)     OnabotulinumtoxinA (BOTOX IJ) Inject 1 Dose as directed every 3 (three) months. (Patient not taking: Reported on 10/11/2019)     ondansetron (ZOFRAN) 8 MG tablet Take 1 tablet (8 mg total) by mouth 2 (two) times daily as needed for refractory nausea / vomiting. (Patient not taking: Reported on 10/11/2019) 30 tablet 1   prochlorperazine (COMPAZINE) 10 MG tablet Take 1 tablet (10 mg total) by mouth every 6 (six) hours as needed (Nausea or vomiting). (Patient not taking: Reported on 10/11/2019) 30 tablet 1   No current facility-administered medications for this visit.    OBJECTIVE: Vitals:   10/11/19 1005  BP: 128/81  Pulse: 67  Temp: 97.9 F (36.6 C)  SpO2: 100%     Body mass index is 31.28 kg/m.    ECOG FS:0 - Asymptomatic  General: Well-developed, well-nourished, no acute distress. Eyes: Pink conjunctiva, anicteric sclera. HEENT: Normocephalic, moist mucous membranes. Breast: Exam deferred today. Lungs: No audible wheezing or  coughing. Heart: Regular rate and rhythm. Abdomen: Soft, nontender, no obvious distention. Musculoskeletal: No edema, cyanosis, or clubbing. Neuro: Alert, answering all questions appropriately. Cranial nerves grossly intact. Skin: No rashes or petechiae noted. Psych: Normal affect.   LAB RESULTS:  Lab Results  Component Value Date   NA 137 06/28/2017   K 4.3 06/28/2017   CL 103 06/28/2017   CO2 26 06/28/2017   GLUCOSE 104 (H) 06/28/2017   BUN 22 (H) 06/28/2017   CREATININE 0.56 06/28/2017   CALCIUM 9.2 06/28/2017   PROT 7.4 06/28/2017   ALBUMIN 4.2 06/28/2017   AST 19 06/28/2017   ALT 19 06/28/2017   ALKPHOS 60 06/28/2017   BILITOT 0.5 06/28/2017   GFRNONAA >60 06/28/2017   GFRAA >60 06/28/2017    Lab Results  Component Value Date   WBC 6.8 06/28/2017   NEUTROABS 4.8 06/28/2017   HGB 13.0 06/28/2017   HCT 38.5 06/28/2017   MCV 88.3 06/28/2017   PLT 301 06/28/2017     STUDIES: No results found.  ASSESSMENT: Pathologic stage Ia poorly differentiated triple negative carcinoma of the lower outer quadrant of the right  breast.  PLAN:    1. Pathologic stage Ia poorly differentiated triple negative carcinoma of the lower outer quadrant of the right breast:  Final pathology confirmed breast primary.  Patient completed 4 cycles adjuvant chemotherapy with Taxotere and Cytoxan on March 17, 2016. She also completed adjuvant XRT.  Previously, CT of the chest, abdomen, and pelvis did not reveal any metastatic disease.  She did not require an aromatase inhibitor given the triple negative status of her disease.  Her most recent mammogram on November 05, 2018 was reported as BI-RADS 2.  Repeat in the next 1 to 2 weeks.  CA 27-29 continues to be within normal limits, today's result is pending.  Return to clinic in 6 months with repeat laboratory work and video assisted telemedicine visit.   2. Genetic testing: Given patient's age and triple negative status of her tumor have offered  genetic testing, but this has not been completed. She does not have any children. She has one brother and he does not have any children. No family history.   I spent a total of 20 minutes reviewing chart data, face-to-face evaluation with the patient, counseling and coordination of care as detailed above.   Patient expressed understanding and was in agreement with this plan. She also understands that She can call clinic at any time with any questions, concerns, or complaints.   Cancer Staging Breast cancer of lower-outer quadrant of right female breast [facility]) Staging form: Breast, AJCC 7th Edition - Clinical stage from 12/20/2015: Stage IA (T1b, N0, M0) - Signed by Lloyd Huger, MD on 12/20/2015    Lloyd Huger, MD 10/11/19 10:50 AM

## 2019-10-11 ENCOUNTER — Inpatient Hospital Stay: Payer: 59 | Attending: Oncology | Admitting: Oncology

## 2019-10-11 ENCOUNTER — Inpatient Hospital Stay: Payer: 59

## 2019-10-11 ENCOUNTER — Other Ambulatory Visit: Payer: Self-pay

## 2019-10-11 ENCOUNTER — Ambulatory Visit: Payer: 59 | Admitting: Radiation Oncology

## 2019-10-11 VITALS — BP 128/81 | HR 67 | Temp 97.9°F | Wt 188.0 lb

## 2019-10-11 DIAGNOSIS — C50511 Malignant neoplasm of lower-outer quadrant of right female breast: Secondary | ICD-10-CM

## 2019-10-11 DIAGNOSIS — Z79899 Other long term (current) drug therapy: Secondary | ICD-10-CM | POA: Insufficient documentation

## 2019-10-11 DIAGNOSIS — Z171 Estrogen receptor negative status [ER-]: Secondary | ICD-10-CM | POA: Diagnosis not present

## 2019-10-11 DIAGNOSIS — Z9221 Personal history of antineoplastic chemotherapy: Secondary | ICD-10-CM | POA: Diagnosis not present

## 2019-10-11 DIAGNOSIS — M1990 Unspecified osteoarthritis, unspecified site: Secondary | ICD-10-CM | POA: Diagnosis not present

## 2019-10-12 LAB — CANCER ANTIGEN 27.29: CA 27.29: 30.9 U/mL (ref 0.0–38.6)

## 2019-11-06 ENCOUNTER — Other Ambulatory Visit: Payer: Self-pay

## 2019-11-06 ENCOUNTER — Ambulatory Visit
Admission: RE | Admit: 2019-11-06 | Discharge: 2019-11-06 | Disposition: A | Discharge status: Home / Self Care | Payer: 59 | Source: Ambulatory Visit | Attending: Oncology | Admitting: Oncology

## 2019-11-06 DIAGNOSIS — Z171 Estrogen receptor negative status [ER-]: Secondary | ICD-10-CM | POA: Insufficient documentation

## 2019-11-06 DIAGNOSIS — C50511 Malignant neoplasm of lower-outer quadrant of right female breast: Secondary | ICD-10-CM | POA: Diagnosis not present

## 2020-04-03 NOTE — Progress Notes (Signed)
[place]  Telephone:([phone]) [phone] Fax:(336) [phone]  ID: Melanie Campos OB: 11/04/1965  MR#: [id number]  QIO#:[id number]  Patient Care Team: Marinda Elk, MD as PCP - General (Physician Assistant) Leonie Green, MD as Referring Physician (Surgery) Lloyd Huger, MD as Consulting Physician (Oncology) Rico Junker, RN as Oncology Nurse Navigator Noreene Filbert, MD as Referring Physician (Radiation Oncology)  I connected with Melanie Campos on 04/08/20 at  1:45 PM EST by video enabled telemedicine visit and verified that I am speaking with the correct person using two identifiers.   I discussed the limitations, risks, security and privacy concerns of performing an evaluation and management service by telemedicine and the availability of in-person appointments. I also discussed with the patient that there may be a patient responsible charge related to this service. The patient expressed understanding and agreed to proceed.   Other persons participating in the visit and their role in the encounter: Patient, MD.  Patient's location: Home. Provider's location: Clinic.  CHIEF COMPLAINT: Pathologic stage Ia poorly differentiated triple negative carcinoma of the lower outer quadrant of the right breast.  INTERVAL HISTORY: Patient agreed to video assisted telemedicine visit for her routine 39-monthevaluation.  She continues to feel well and remains asymptomatic. She has no neurologic complaints. She denies any recent fevers or illnesses. She has a good appetite and denies weight loss.  She denies any chest pain, shortness of breath, cough, or hemoptysis.  She denies any nausea, vomiting, constipation, or diarrhea. She has no urinary complaints.  Patient feels at her baseline offers no specific complaints today.  REVIEW OF SYSTEMS:   Review of Systems  Constitutional: Negative.  Negative for fever, malaise/fatigue and weight loss.  Respiratory:  Negative.  Negative for cough, hemoptysis and shortness of breath.   Cardiovascular: Negative.  Negative for chest pain and leg swelling.  Gastrointestinal: Negative.  Negative for abdominal pain, blood in stool, constipation, diarrhea and melena.  Genitourinary: Negative.  Negative for flank pain.  Musculoskeletal: Negative.  Negative for back pain.  Skin: Negative.  Negative for rash.  Neurological: Negative.  Negative for sensory change, focal weakness and weakness.  Psychiatric/Behavioral: Negative.  The patient is not nervous/anxious.     As per HPI. Otherwise, a complete review of systems is negative.  PAST MEDICAL HISTORY: Past Medical History:  Diagnosis Date  . Arthritis    in neck  . [place]   [place] breast/chemo and radiation  . [place]  . GERD (gastroesophageal reflux disease)   . Kidney stones   . Last menstrual period (LMP) > 10 days ago 1995  . Personal history of chemotherapy 2017   Right breast  . Personal history of radiation therapy 2017   Right breast    PAST SURGICAL HISTORY: Past Surgical History:  Procedure Laterality Date  . ABDOMINAL HYSTERECTOMY  1995   partial  . BREAST BIOPSY Right 2017   Invasice mammory carcinoma  . BREAST EXCISIONAL BIOPSY Right 12/08/2015   lumpectomy  . COLONOSCOPY  2016  . PARTIAL MASTECTOMY WITH NEEDLE LOCALIZATION Right 12/08/2015   Procedure: PARTIAL MASTECTOMY WITH NEEDLE LOCALIZATION;  Surgeon: JLeonie Green MD;  Location: [facility] OR[facility];  Service: General;  Laterality: Right;  . SENTINEL NODE BIOPSY Right 12/08/2015   Procedure: SENTINEL NODE BIOPSY;  Surgeon: JLeonie Green MD;  Location: [facility] OR[facility];  Service: General;  Laterality: Right;    FAMILY HISTORY: Family History  Problem Relation Age  of Onset  . Breast cancer Neg Hx     ADVANCED DIRECTIVES (Y/N):  N  HEALTH MAINTENANCE: Social History   Tobacco Use  . Smoking status: Never Smoker  .  Smokeless tobacco: Never Used  Substance Use Topics  . Alcohol use: No  . Drug use: No     Colonoscopy:  PAP:  Bone density:  Lipid panel:  No Known Allergies  Current Outpatient Medications  Medication Sig Dispense Refill  . acetaminophen (TYLENOL) 325 MG tablet Take 2 tablets by mouth every 4 (four) hours as needed. (Patient not taking: Reported on 10/11/2019)    . amitriptyline (ELAVIL) 10 MG tablet Take by mouth.    Marland Kitchen aspirin EC 81 MG tablet Take 1 tablet by mouth daily. (Patient not taking: Reported on 10/11/2019)    . Biotin 1 MG CAPS Take by mouth.    . carisoprodol (SOMA) 350 MG tablet Take 1 tablet by mouth 4 (four) times daily as needed. (Patient not taking: Reported on 10/11/2019)    . cyclobenzaprine (FLEXERIL) 5 MG tablet   0  . fluticasone (FLONASE) 50 MCG/ACT nasal spray Place 1 spray into both nostrils daily.    . hyoscyamine (LEVSIN, ANASPAZ) 0.125 MG tablet  (Patient not taking: Reported on 10/11/2019)  1  . ibuprofen (ADVIL,MOTRIN) 200 MG tablet Take 1 tablet by mouth as needed. (Patient not taking: Reported on 10/11/2019)    . lactulose (CHRONULAC) 10 GM/15ML solution Take 45 mLs (30 g total) by mouth 2 (two) times daily as needed for mild constipation or moderate constipation. (Patient not taking: Reported on 10/11/2019) 240 mL 0  . meloxicam (MOBIC) 7.5 MG tablet Take 1 tablet by mouth daily as needed. (Patient not taking: Reported on 10/11/2019)    . Multiple Vitamin (MULTI-VITAMINS) TABS Take 1 tablet by mouth daily. (Patient not taking: Reported on 10/11/2019)    . omeprazole (PRILOSEC) 20 MG capsule Take 1 capsule by mouth daily. (Patient not taking: Reported on 10/11/2019)    . OnabotulinumtoxinA (BOTOX IJ) Inject 1 Dose as directed every 3 (three) months. (Patient not taking: Reported on 10/11/2019)    . ondansetron (ZOFRAN) 8 MG tablet Take 1 tablet (8 mg total) by mouth 2 (two) times daily as needed for refractory nausea / vomiting. (Patient not taking: Reported on 10/11/2019) 30  tablet 1  . prochlorperazine (COMPAZINE) 10 MG tablet Take 1 tablet (10 mg total) by mouth every 6 (six) hours as needed (Nausea or vomiting). (Patient not taking: Reported on 10/11/2019) 30 tablet 1   No current facility-administered medications for this visit.    OBJECTIVE: There were no vitals filed for this visit.   There is no height or weight on file to calculate BMI.    ECOG FS:0 - Asymptomatic  General: Well-developed, well-nourished, no acute distress. HEENT: Normocephalic. Neuro: Alert, answering all questions appropriately. Cranial nerves grossly intact. Psych: Normal affect.  LAB RESULTS:  Lab Results  Component Value Date   NA 137 06/28/2017   K 4.3 06/28/2017   CL 103 06/28/2017   CO2 26 06/28/2017   GLUCOSE 104 (H) 06/28/2017   BUN 22 (H) 06/28/2017   CREATININE 0.56 06/28/2017   CALCIUM 9.2 06/28/2017   PROT 7.4 06/28/2017   ALBUMIN 4.2 06/28/2017   AST 19 06/28/2017   ALT 19 06/28/2017   ALKPHOS 60 06/28/2017   BILITOT 0.5 06/28/2017   GFRNONAA >60 06/28/2017   GFRAA >60 06/28/2017    Lab Results  Component Value Date   WBC 6.8  06/28/2017   NEUTROABS 4.8 06/28/2017   HGB 13.0 06/28/2017   HCT 38.5 06/28/2017   MCV 88.3 06/28/2017   PLT 301 06/28/2017     STUDIES: No results found.  ASSESSMENT: Pathologic stage Ia poorly differentiated triple negative carcinoma of the lower outer quadrant of the right breast.  PLAN:    1. Pathologic stage Ia poorly differentiated triple negative carcinoma of the lower outer quadrant of the right breast:  Final pathology confirmed breast primary.  Patient completed 4 cycles adjuvant chemotherapy with Taxotere and Cytoxan on March 17, 2016. She also completed adjuvant XRT.  Previously, CT of the chest, abdomen, and pelvis did not reveal any metastatic disease.  She did not require an aromatase inhibitor given the triple negative status of her disease.  Her most recent mammogram on November 06, 2019 was reported as  BI-RADS 2.  Repeat in September 2022.  Most recent CA 27-29 was within normal limits at 29.7.  No intervention is needed at this time.  Return to clinic in 6 months with repeat laboratory work and video assisted telemedicine visit.   2. Genetic testing: Given patient's age and triple negative status of her tumor have offered genetic testing, but this has not been completed. She does not have any children. She has one brother and he does not have any children. No family history.    I provided 20 minutes of face-to-face video visit time during this encounter which included chart review, counseling, and coordination of care as documented above.  Patient expressed understanding and was in agreement with this plan. She also understands that She can call clinic at any time with any questions, concerns, or complaints.   Cancer Staging Breast cancer of lower-outer quadrant of right female breast [facility]) Staging form: Breast, AJCC 7th Edition - Clinical stage from 12/20/2015: Stage IA (T1b, N0, M0) - Signed by Lloyd Huger, MD on 12/20/2015 Laterality: Right Estrogen receptor status: Negative Progesterone receptor status: Negative HER2 status: Negative    Lloyd Huger, MD 04/08/20 6:40 AM

## 2020-04-06 ENCOUNTER — Other Ambulatory Visit: Payer: Self-pay

## 2020-04-06 ENCOUNTER — Inpatient Hospital Stay: Payer: 59 | Attending: Oncology

## 2020-04-06 DIAGNOSIS — Z171 Estrogen receptor negative status [ER-]: Secondary | ICD-10-CM | POA: Insufficient documentation

## 2020-04-06 DIAGNOSIS — C50511 Malignant neoplasm of lower-outer quadrant of right female breast: Secondary | ICD-10-CM | POA: Diagnosis not present

## 2020-04-07 ENCOUNTER — Inpatient Hospital Stay: Payer: 59 | Attending: Oncology | Admitting: Oncology

## 2020-04-07 DIAGNOSIS — Z171 Estrogen receptor negative status [ER-]: Secondary | ICD-10-CM

## 2020-04-07 DIAGNOSIS — C50511 Malignant neoplasm of lower-outer quadrant of right female breast: Secondary | ICD-10-CM | POA: Diagnosis not present

## 2020-04-07 LAB — CANCER ANTIGEN 27.29: CA 27.29: 29.7 U/mL (ref 0.0–38.6)

## 2020-10-04 NOTE — Progress Notes (Signed)
[place]  [phone] [phone]) [phone]  ID: Melanie Campos OB: [birth year]  MR#: [id number]  VKP#:[id number]  Patient Care Team: Marinda Elk, MD as PCP - General (Physician Assistant) Leonie Green, MD as Referring Physician (Surgery) Lloyd Huger, MD as Consulting Physician (Oncology) Rico Junker, RN as Oncology Nurse Navigator Noreene Filbert, MD as Referring Physician (Radiation Oncology)  I connected with Melanie Campos on 10/06/20 at  2:00 PM EDT by video enabled telemedicine visit and verified that I am speaking with the correct person using two identifiers.   I discussed the limitations, risks, security and privacy concerns of performing an evaluation and management service by telemedicine and the availability of in-person appointments. I also discussed with the patient that there may be a patient responsible charge related to this service. The patient expressed understanding and agreed to proceed.   Other persons participating in the visit and their role in the encounter: Patient, MD.  Patient's location: Home. Provider's location: Clinic.  CHIEF COMPLAINT: Pathologic stage Ia poorly differentiated triple negative carcinoma of the lower outer quadrant of the right breast.  INTERVAL HISTORY: Patient agreed to video assisted telemedicine visit for her routine 25-monthevaluation.  She continues to feel well and remains asymptomatic. She has no neurologic complaints. She denies any recent fevers or illnesses. She has a good appetite and denies weight loss.  She denies any chest pain, shortness of breath, cough, or hemoptysis.  She denies any nausea, vomiting, constipation, or diarrhea. She has no urinary complaints.  Patient offers no specific complaints today.  REVIEW OF SYSTEMS:   Review of Systems  Constitutional: Negative.  Negative for fever, malaise/fatigue and weight loss.  Respiratory: Negative.  Negative for  cough, hemoptysis and shortness of breath.   Cardiovascular: Negative.  Negative for chest pain and leg swelling.  Gastrointestinal: Negative.  Negative for abdominal pain, blood in stool, constipation, diarrhea and melena.  Genitourinary: Negative.  Negative for flank pain.  Musculoskeletal: Negative.  Negative for back pain.  Skin: Negative.  Negative for rash.  Neurological: Negative.  Negative for sensory change, focal weakness and weakness.  Psychiatric/Behavioral: Negative.  The patient is not nervous/anxious.    As per HPI. Otherwise, a complete review of systems is negative.  PAST MEDICAL HISTORY: Past Medical History:  Diagnosis Date   Arthritis    in neck   [place]   right breast/chemo and radiation   [place], [place] breast [place]18[place]   GERD (gastroesophageal reflux disease)    Kidney stones    Last menstrual period (LMP) > 10 days ago [place]   Right breast   Personal history of radiation therapy 2017   Right breast    PAST SURGICAL HISTORY: Past Surgical History:  Procedure Laterality Date   ABDOMINAL HYSTERECTOMY  1995   partial   BREAST BIOPSY Right 2017   Invasice mammory carcinoma   BREAST EXCISIONAL BIOPSY Right 12/08/2015   lumpectomy   COLONOSCOPY  2016   PARTIAL MASTECTOMY WITH NEEDLE LOCALIZATION Right 12/08/2015   Procedure: PARTIAL MASTECTOMY WITH NEEDLE LOCALIZATION;  Surgeon: JLeonie Green MD;  Location: [facility] ORS;  Service: General;  Laterality: Right;   SENTINEL NODE BIOPSY Right 12/08/2015   Procedure: SENTINEL NODE BIOPSY;  Surgeon: JLeonie Green MD;  Location: [facility] OR[facility];  Service: General;  Laterality: Right;    FAMILY HISTORY: Family History  Problem Relation Age of Onset   Breast  cancer Neg Hx     ADVANCED DIRECTIVES (Y/N):  N  HEALTH MAINTENANCE: Social History   Tobacco Use   Smoking status: Never   Smokeless tobacco: Never  Substance Use Topics    Alcohol use: No   Drug use: No     Colonoscopy:  PAP:  Bone density:  Lipid panel:  No Known Allergies  Current Outpatient Medications  Medication Sig Dispense Refill   acetaminophen (TYLENOL) 325 MG tablet Take 2 tablets by mouth every 4 (four) hours as needed.     aspirin EC 81 MG tablet Take 1 tablet by mouth daily.     Biotin 1 MG CAPS Take by mouth.     Calcium Carbonate-Vit D-Min (CALCIUM 1200 PO) Take by mouth.     fluticasone (FLONASE) 50 MCG/ACT nasal spray Place 1 spray into both nostrils daily.     ibuprofen (ADVIL,MOTRIN) 200 MG tablet Take 1 tablet by mouth as needed.     meloxicam (MOBIC) 7.5 MG tablet Take 1 tablet by mouth daily as needed.     Multiple Vitamin (MULTI-VITAMINS) TABS Take 1 tablet by mouth daily.     omeprazole (PRILOSEC) 20 MG capsule Take 1 capsule by mouth daily.     OnabotulinumtoxinA (BOTOX IJ) Inject 1 Dose as directed every 3 (three) months.     solifenacin (VESICARE) 5 MG tablet Take by mouth.     No current facility-administered medications for this visit.    OBJECTIVE: There were no vitals filed for this visit.   There is no height or weight on file to calculate BMI.    ECOG FS:0 - Asymptomatic  General: Well-developed, well-nourished, no acute distress. HEENT: Normocephalic. Neuro: Alert, answering all questions appropriately. Cranial nerves grossly intact. Psych: Normal affect.  LAB RESULTS:  Lab Results  Component Value Date   NA 137 06/28/2017   K 4.3 06/28/2017   CL 103 06/28/2017   CO2 26 06/28/2017   GLUCOSE 104 (H) 06/28/2017   BUN 22 (H) 06/28/2017   CREATININE 0.56 06/28/2017   CALCIUM 9.2 06/28/2017   PROT 7.4 06/28/2017   ALBUMIN 4.2 06/28/2017   AST 19 06/28/2017   ALT 19 06/28/2017   ALKPHOS 60 06/28/2017   BILITOT 0.5 06/28/2017   GFRNONAA >60 06/28/2017   GFRAA >60 06/28/2017    Lab Results  Component Value Date   WBC 6.8 06/28/2017   NEUTROABS 4.8 06/28/2017   HGB 13.0 06/28/2017   HCT 38.5  06/28/2017   MCV 88.3 06/28/2017   PLT 301 06/28/2017     STUDIES: No results found.  ASSESSMENT: Pathologic stage Ia poorly differentiated triple negative carcinoma of the lower outer quadrant of the right breast.  PLAN:    1. Pathologic stage Ia poorly differentiated triple negative carcinoma of the lower outer quadrant of the right breast:  Final pathology confirmed breast primary.  Patient completed 4 cycles adjuvant chemotherapy with Taxotere and Cytoxan on March 17, 2016. She also completed adjuvant XRT.  Previously, CT of the chest, abdomen, and pelvis did not reveal any metastatic disease.  She did not require an aromatase inhibitor given the triple negative status of her disease.  Her most recent mammogram on November 06, 2019 was reported as BI-RADS 2.  Repeat in September 2022.  Her most recent CA 27-29 continues to be within normal limits at 27.9.  No intervention is needed at this time.  Return to clinic in 6 months with repeat laboratory work and video assisted telemedicine visit.  Patient will  be 5 years removed from completing chemotherapy at that time and likely can be transitioned to yearly evaluation.   2. Genetic testing: Given patient's age and triple negative status of her tumor have offered genetic testing, but this has not been completed. She does not have any children. She has one brother and he does not have any children. No family history.   I provided 20 minutes of face-to-face video visit time during this encounter which included chart review, counseling, and coordination of care as documented above.   Patient expressed understanding and was in agreement with this plan. She also understands that She can call clinic at any time with any questions, concerns, or complaints.   Cancer Staging Breast cancer of lower-outer quadrant of right female breast [facility]) Staging form: Breast, AJCC 7th Edition - Clinical stage from 12/20/2015: Stage IA (T1b, N0, M0) - Signed by  Lloyd Huger, MD on 12/20/2015 Laterality: Right Estrogen receptor status: Negative Progesterone receptor status: Negative HER2 status: Negative    Lloyd Huger, MD 10/06/20 4:12 PM

## 2020-10-05 ENCOUNTER — Other Ambulatory Visit: Payer: Self-pay

## 2020-10-05 ENCOUNTER — Inpatient Hospital Stay: Payer: 59 | Attending: Oncology

## 2020-10-05 DIAGNOSIS — C50511 Malignant neoplasm of lower-outer quadrant of right female breast: Secondary | ICD-10-CM

## 2020-10-05 DIAGNOSIS — Z9221 Personal history of antineoplastic chemotherapy: Secondary | ICD-10-CM | POA: Insufficient documentation

## 2020-10-05 DIAGNOSIS — Z853 Personal history of malignant neoplasm of breast: Secondary | ICD-10-CM | POA: Insufficient documentation

## 2020-10-05 DIAGNOSIS — Z923 Personal history of irradiation: Secondary | ICD-10-CM | POA: Insufficient documentation

## 2020-10-05 DIAGNOSIS — Z171 Estrogen receptor negative status [ER-]: Secondary | ICD-10-CM

## 2020-10-06 ENCOUNTER — Inpatient Hospital Stay (HOSPITAL_BASED_OUTPATIENT_CLINIC_OR_DEPARTMENT_OTHER): Payer: 59 | Admitting: Oncology

## 2020-10-06 ENCOUNTER — Encounter: Payer: Self-pay | Admitting: Oncology

## 2020-10-06 DIAGNOSIS — C50511 Malignant neoplasm of lower-outer quadrant of right female breast: Secondary | ICD-10-CM | POA: Diagnosis not present

## 2020-10-06 DIAGNOSIS — Z171 Estrogen receptor negative status [ER-]: Secondary | ICD-10-CM | POA: Diagnosis not present

## 2020-10-06 LAB — CANCER ANTIGEN 27.29: CA 27.29: 27.9 U/mL (ref 0.0–38.6)

## 2020-11-06 ENCOUNTER — Ambulatory Visit
Admission: RE | Admit: 2020-11-06 | Discharge: 2020-11-06 | Disposition: A | Discharge status: Home / Self Care | Payer: 59 | Source: Ambulatory Visit | Attending: Oncology | Admitting: Oncology

## 2020-11-06 ENCOUNTER — Other Ambulatory Visit: Payer: Self-pay

## 2020-11-06 DIAGNOSIS — Z171 Estrogen receptor negative status [ER-]: Secondary | ICD-10-CM | POA: Insufficient documentation

## 2020-11-06 DIAGNOSIS — C50511 Malignant neoplasm of lower-outer quadrant of right female breast: Secondary | ICD-10-CM | POA: Diagnosis present

## 2020-11-06 DIAGNOSIS — Z1231 Encounter for screening mammogram for malignant neoplasm of breast: Secondary | ICD-10-CM | POA: Diagnosis not present

## 2021-04-08 ENCOUNTER — Inpatient Hospital Stay: Payer: 59 | Attending: Oncology

## 2021-04-08 ENCOUNTER — Other Ambulatory Visit: Payer: Self-pay

## 2021-04-08 DIAGNOSIS — Z853 Personal history of malignant neoplasm of breast: Secondary | ICD-10-CM | POA: Diagnosis not present

## 2021-04-08 DIAGNOSIS — Z9221 Personal history of antineoplastic chemotherapy: Secondary | ICD-10-CM | POA: Diagnosis not present

## 2021-04-08 DIAGNOSIS — C50511 Malignant neoplasm of lower-outer quadrant of right female breast: Secondary | ICD-10-CM

## 2021-04-08 DIAGNOSIS — Z923 Personal history of irradiation: Secondary | ICD-10-CM | POA: Insufficient documentation

## 2021-04-09 LAB — CANCER ANTIGEN 27.29: CA 27.29: 25.2 U/mL (ref 0.0–38.6)

## 2021-04-12 ENCOUNTER — Encounter: Payer: Self-pay | Admitting: Oncology

## 2021-04-12 ENCOUNTER — Other Ambulatory Visit: Payer: Self-pay

## 2021-04-12 ENCOUNTER — Inpatient Hospital Stay (HOSPITAL_BASED_OUTPATIENT_CLINIC_OR_DEPARTMENT_OTHER): Payer: 59 | Admitting: Oncology

## 2021-04-12 DIAGNOSIS — Z171 Estrogen receptor negative status [ER-]: Secondary | ICD-10-CM | POA: Diagnosis not present

## 2021-04-12 DIAGNOSIS — C50511 Malignant neoplasm of lower-outer quadrant of right female breast: Secondary | ICD-10-CM | POA: Diagnosis not present

## 2021-04-12 NOTE — Progress Notes (Signed)
[place]  Telephone:(336) [phone] Fax:(336) [phone]  ID: Manfred Arch OB: [birth year]  MR#: [id number]  TDV#:[id number]  Patient Care Team: Marinda Elk, MD as PCP - General (Physician Assistant) Leonie Green, MD as Referring Physician (Surgery) Lloyd Huger, MD as Consulting Physician (Oncology) Rico Junker, RN as Oncology Nurse Navigator Noreene Filbert, MD as Referring Physician (Radiation Oncology)  I connected with Manfred Arch on 04/12/21 at  2:45 PM EST by video enabled telemedicine visit and verified that I am speaking with the correct person using two identifiers.   I discussed the limitations, risks, security and privacy concerns of performing an evaluation and management service by telemedicine and the availability of in-person appointments. I also discussed with the patient that there may be a patient responsible charge related to this service. The patient expressed understanding and agreed to proceed.   Other persons participating in the visit and their role in the encounter: Patient, NP.  Patients location: Home. Providers location: Clinic.  CHIEF COMPLAINT: Pathologic stage Ia poorly differentiated triple negative carcinoma of the lower outer quadrant of the right breast.  HPI-Pathologic stage Ia poorly differentiated triple negative carcinoma of the lower outer quadrant of the right breast:  Final pathology confirmed breast primary.  Patient completed 4 cycles adjuvant chemotherapy with Taxotere and Cytoxan on March 17, 2016. She also completed adjuvant XRT.  Previously, CT of the chest, abdomen, and pelvis did not reveal any metastatic disease.  She did not require an aromatase inhibitor given the triple negative status of her disease.    INTERVAL HISTORY: She continues to feel well and is asymptomatic.  Last mammogram from September 2022 was BI-RADS Category 1 negative.  Repeat in September 2023.  Performs monthly  breast exams.  Denies any new concerns.  REVIEW OF SYSTEMS:   Review of Systems  Constitutional: Negative.  Negative for chills, fever, malaise/fatigue and weight loss.  HENT:  Negative for congestion, ear pain and tinnitus.   Eyes: Negative.  Negative for blurred vision and double vision.  Respiratory: Negative.  Negative for cough, sputum production and shortness of breath.   Cardiovascular: Negative.  Negative for chest pain, palpitations and leg swelling.  Gastrointestinal: Negative.  Negative for abdominal pain, constipation, diarrhea, nausea and vomiting.  Genitourinary:  Negative for dysuria, frequency and urgency.  Musculoskeletal:  Negative for back pain and falls.  Skin: Negative.  Negative for rash.  Neurological: Negative.  Negative for weakness and headaches.  Endo/Heme/Allergies: Negative.  Does not bruise/bleed easily.  Psychiatric/Behavioral: Negative.  Negative for depression. The patient is not nervous/anxious and does not have insomnia.    As per HPI. Otherwise, a complete review of systems is negative.  PAST MEDICAL HISTORY: Past Medical History:  Diagnosis Date   Arthritis    in neck   Breast cancer ([place]) 2017   right breast/chemo and radiation   Breast cancer, right breast ([place]) 11/18/2015   GERD (gastroesophageal reflux disease)    Kidney stones    Last menstrual period (LMP) > 10 days ago [place] history of chemotherapy 2017   Right breast   Personal history of radiation therapy 2017   Right breast    PAST SURGICAL HISTORY: Past Surgical History:  Procedure Laterality Date   ABDOMINAL HYSTERECTOMY  1995   partial   BREAST BIOPSY Right 2017   Invasice mammory carcinoma   BREAST EXCISIONAL BIOPSY Right 12/08/2015   lumpectomy   COLONOSCOPY  2016   PARTIAL MASTECTOMY  WITH NEEDLE LOCALIZATION Right 12/08/2015   Procedure: PARTIAL MASTECTOMY WITH NEEDLE LOCALIZATION;  Surgeon: Leonie Green, MD;  Location: [facility] ORS;  Service: General;   Laterality: Right;   SENTINEL NODE BIOPSY Right 12/08/2015   Procedure: SENTINEL NODE BIOPSY;  Surgeon: Leonie Green, MD;  Location: [facility] OR[facility];  Service: General;  Laterality: Right;    FAMILY HISTORY: Family History  Problem Relation Age of Onset   Breast cancer Neg Hx     ADVANCED DIRECTIVES (Y/N):  N  HEALTH MAINTENANCE: Social History   Tobacco Use   Smoking status: Never   Smokeless tobacco: Never  Substance Use Topics   Alcohol use: No   Drug use: No     Colonoscopy:  PAP:  Bone density:  Lipid panel:  No Known Allergies  Current Outpatient Medications  Medication Sig Dispense Refill   acetaminophen (TYLENOL) 325 MG tablet Take 2 tablets by mouth every 4 (four) hours as needed.     aspirin EC 81 MG tablet Take 1 tablet by mouth daily.     Biotin 1 MG CAPS Take by mouth.     Calcium Carbonate-Vit D-Min (CALCIUM 1200 PO) Take by mouth.     fluticasone (FLONASE) 50 MCG/ACT nasal spray Place 1 spray into both nostrils daily.     ibuprofen (ADVIL,MOTRIN) 200 MG tablet Take 1 tablet by mouth as needed.     meloxicam (MOBIC) 7.5 MG tablet Take 1 tablet by mouth daily as needed.     Multiple Vitamin (MULTI-VITAMINS) TABS Take 1 tablet by mouth daily.     omeprazole (PRILOSEC) 20 MG capsule Take 1 capsule by mouth daily.     OnabotulinumtoxinA (BOTOX IJ) Inject 1 Dose as directed every 3 (three) months.     No current facility-administered medications for this visit.    OBJECTIVE: There were no vitals filed for this visit.   There is no height or weight on file to calculate BMI.    ECOG FS:0 - Asymptomatic  Physical Exam Constitutional:      Appearance: Normal appearance.  Pulmonary:     Effort: Pulmonary effort is normal.  Neurological:     Mental Status: She is alert and oriented to person, place, and time.    LAB RESULTS:  Lab Results  Component Value Date   NA 137 06/28/2017   K 4.3 06/28/2017   CL 103 06/28/2017   CO2 26 06/28/2017   GLUCOSE  104 (H) 06/28/2017   BUN 22 (H) 06/28/2017   CREATININE 0.56 06/28/2017   CALCIUM 9.2 06/28/2017   PROT 7.4 06/28/2017   ALBUMIN 4.2 06/28/2017   AST 19 06/28/2017   ALT 19 06/28/2017   ALKPHOS 60 06/28/2017   BILITOT 0.5 06/28/2017   GFRNONAA >60 06/28/2017   GFRAA >60 06/28/2017    Lab Results  Component Value Date   WBC 6.8 06/28/2017   NEUTROABS 4.8 06/28/2017   HGB 13.0 06/28/2017   HCT 38.5 06/28/2017   MCV 88.3 06/28/2017   PLT 301 06/28/2017     STUDIES: No results found.  ASSESSMENT: Pathologic stage Ia poorly differentiated triple negative carcinoma of the lower outer quadrant of the right breast.  PLAN:    Appears to be doing well.  She will need repeat screening mammogram in September 2023.  She now can be evaluated annually.  We will arrange for mammogram in September 2023.  Disposition-mammogram in September 2023.  Return to clinic in 1 year.  I provided 20 minutes of face-to-face video visit  time during this encounter which included chart review, counseling, and coordination of care as documented above.   Patient expressed understanding and was in agreement with this plan. She also understands that She can call clinic at any time with any questions, concerns, or complaints.    Cancer Staging  Breast cancer of lower-outer quadrant of right female breast [facility]) Staging form: Breast, AJCC 7th Edition - Clinical stage from 12/20/2015: Stage IA (T1b, N0, M0) - Signed by Lloyd Huger, MD on 12/20/2015 Laterality: Right Estrogen receptor status: Negative Progesterone receptor status: Negative HER2 status: Negative    Jacquelin Hawking, NP 04/12/21 2:43 PM

## 2021-04-12 NOTE — Progress Notes (Signed)
Patient states no concerns at the moment. 

## 2022-04-12 ENCOUNTER — Inpatient Hospital Stay: Payer: Self-pay | Admitting: Oncology
